# Patient Record
Sex: Male | Born: 1960 | Race: Black or African American | Hispanic: No | Smoking: Current every day smoker
Health system: Southern US, Community
[De-identification: ages and names within clinical notes are randomized; demographics above are authoritative.]

## PROBLEM LIST (undated history)

## (undated) DIAGNOSIS — I639 Cerebral infarction, unspecified: Secondary | ICD-10-CM

## (undated) DIAGNOSIS — G8190 Hemiplegia, unspecified affecting unspecified side: Secondary | ICD-10-CM

## (undated) HISTORY — PX: APPENDECTOMY: SHX54

## (undated) HISTORY — PX: TONSILLECTOMY: SUR1361

## (undated) HISTORY — DX: Cerebral infarction, unspecified: I63.9

---

## 1997-09-19 ENCOUNTER — Encounter: Admission: RE | Admit: 1997-09-19 | Discharge: 1997-12-18 | Payer: Self-pay | Admitting: Neurology

## 1997-11-13 ENCOUNTER — Ambulatory Visit (HOSPITAL_COMMUNITY): Admission: RE | Admit: 1997-11-13 | Discharge: 1997-11-13 | Payer: Self-pay | Admitting: Neurology

## 1999-06-22 ENCOUNTER — Encounter: Payer: Self-pay | Admitting: Emergency Medicine

## 1999-06-22 ENCOUNTER — Emergency Department (HOSPITAL_COMMUNITY): Admission: EM | Admit: 1999-06-22 | Discharge: 1999-06-22 | Payer: Self-pay | Admitting: Emergency Medicine

## 2002-08-06 ENCOUNTER — Encounter: Payer: Self-pay | Admitting: Family Medicine

## 2002-08-06 ENCOUNTER — Encounter: Admission: RE | Admit: 2002-08-06 | Discharge: 2002-08-06 | Payer: Self-pay | Admitting: Family Medicine

## 2004-11-30 ENCOUNTER — Emergency Department (HOSPITAL_COMMUNITY): Admission: EM | Admit: 2004-11-30 | Discharge: 2004-11-30 | Payer: Self-pay | Admitting: Family Medicine

## 2004-12-06 ENCOUNTER — Emergency Department (HOSPITAL_COMMUNITY): Admission: EM | Admit: 2004-12-06 | Discharge: 2004-12-06 | Payer: Self-pay | Admitting: Emergency Medicine

## 2008-02-09 ENCOUNTER — Emergency Department (HOSPITAL_COMMUNITY): Admission: EM | Admit: 2008-02-09 | Discharge: 2008-02-10 | Payer: Self-pay | Admitting: Emergency Medicine

## 2010-05-14 LAB — BASIC METABOLIC PANEL
BUN: 7 mg/dL (ref 6–23)
CO2: 28 mEq/L (ref 19–32)
Calcium: 9.2 mg/dL (ref 8.4–10.5)
Chloride: 105 mEq/L (ref 96–112)
Creatinine, Ser: 0.85 mg/dL (ref 0.4–1.5)
GFR calc Af Amer: >60 mL/min (ref >60)
GFR calc non Af Amer: >60 mL/min (ref >60)
Glucose, Bld: 100 mg/dL — ABNORMAL HIGH (ref 70–99)
Potassium: 3.2 mEq/L — ABNORMAL LOW (ref 3.5–5.1)
Sodium: 141 mEq/L (ref 135–145)

## 2010-05-14 LAB — CK TOTAL AND CKMB (NOT AT ARMC)
Relative Index: INVALID (ref 0.0–2.5)
Total CK: 79 U/L (ref 7–232)

## 2010-05-14 LAB — DIFFERENTIAL
Basophils Relative: 1 % (ref 0–1)
Eosinophils Relative: 2 % (ref 0–5)
Monocytes Absolute: 0.4 10*3/uL (ref 0.1–1.0)
Monocytes Relative: 7 % (ref 3–12)
Neutro Abs: 2.2 10*3/uL (ref 1.7–7.7)

## 2010-05-14 LAB — CBC
MCHC: 34.1 g/dL (ref 30.0–36.0)
MCV: 87.3 fL (ref 78.0–100.0)
RBC: 5.37 MIL/uL (ref 4.22–5.81)
RDW: 13.6 % (ref 11.5–15.5)

## 2010-06-15 NOTE — Consult Note (Signed)
Vincent Byrd              ACCOUNT NO.:  [id number]   MEDICAL RECORD NO.:  [id number]          PATIENT TYPE:  EMS   LOCATION:  ED                           FACILITY:  [facility]   PHYSICIAN:  Deanna Artis. Hickling, M.D.DATE OF BIRTH:  1960/07/21   DATE OF CONSULTATION:  12/06/2004  DATE OF DISCHARGE:                                   CONSULTATION   HISTORY OF PRESENT ILLNESS:  [name] is a nearly 50 year old right handed  African American gentleman who presented tonight with chief complaint of  numbness in his left arm.   The patient was hospitalized at [facility] in 1997 with sudden onset  of left sided weakness.  The patient had surgery in the right brain.  I am  not certain why this was done, whether there was increase in intracranial  pressure or subdural.  CT scan of the brain carried out tonight shows no  clear cut lesions, normally mild prominence of the right sylvian fissure in  comparison with the left.   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  He travels in a motorized wheelchair.   The patient has had pain in his right shoulder.  This was evaluated six days  ago in the [facility] emergency room, and did not show any definite  findings.  He was treated with nonsteroidal anti-inflammatories as well as a  tapering dose of prednisone.  He has had some relief.   The patient continues to complain of pain in his neck.  Tonight, as he was  laying down, around 6:45 p.m., he noted that there was paresthetic numbness  in his left arm.  This went from his shoulder down to his fingertips.  There  was no other change in his examination.  He was brought into [facility] and code-stroke was called.  I evaluated the patient at the request  of the emergency room physicians because of this.   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  He takes an Ecotrin every day.  His other medications at the time  are p.r.n. including Etodolac 400 mg b.i.d., prednisone in a tapering dose  and hydrocodone AT/AP.   ALLERGIES:  No known drug allergies.   PAST MEDICAL HISTORY:  1.  Right craniotomy.  2.  Appendectomy.  3.  Feeding gastrostomy which has since been removed.  4.  Tonsillectomy.   SOCIAL HISTORY:  The patient drinks a 40 ounce can of beer sporadically.  He  has not had one in nearly a month.  He smokes a pack of cigarettes per day.   FAMILY HISTORY:  Remarkable for diabetes mellitus in mother and two sisters.  Atherosclerotic cardiovascular disease in his father.  No history of stroke  in his family.   PHYSICAL EXAMINATION:  GENERAL APPEARANCE:  This is a man with labile  motions who periodically laughs when he is stressed.  He does not cry.  He  shows no signs of dysphagia.  He has no dysarthria.  The patient is awake,  alert and oriented.  HEENT:  Cranial nerves:  Round reactive pupils.  Visual fields full.  He has  cataracts, and I cannot see inside his eyes.  He has symmetric facial  strength, midline tongue and uvula.  Air conduction greater than bone  conduction bilaterally.  NEUROLOGICAL:  Motor examination shows a spastic paresis involving the left  side more so than the right, but the right leg is significantly and rather  severely involved.  He has spastic 4/5 strength in the biceps, 3/5 in the  triceps, 3/5 in the deltoid, is nearly 0/5 distally on the left.  The left  leg he can barely lift it against gravity.  He is not able to flex or extend  it and can barely wiggle his foot back in dorsiflexion and plantar flexion.  The right side shows significant weakness in the 3/5 range in the hip  flexors.  He can slightly bend his knee and extend it and dorsiflexor and  plantarflex his foot.  The right side is entirely normal with excellent  strength.  Good fine motor movements and  finger appositions and no pronator  drift.  Sensation shows a left hemihypesthesia.  There is a hyperesthesia in  the right arm.  Can find no dermatomal pattern nor can I find a flexus  pattern as best I can determine.  The patient had good vibratory sensation  and fairy equal proprioception.  His stereognosis is actually fairly good,  both in the right hand and the pruritic left hand.  Deep tendon reflexes  show left reflex predominance, for the patient has bilateral ankle clonus,  left greater than right.  He has bilateral extensor plantar responses.   IMPRESSION:  Numbness in the left arm.  I do not believe that this is a  stroke.  As I mentioned, I reviewed the CT scan and it shows craniotomy  defect, but no other significant focal findings.  EKG showed regular sinus  rhythm with minimal voltage criteria for left ventricular hypertrophy.  C-  met:  Sodium 138, potassium 3.3, chloride 101, CO2 26, glucose 79, BUN 10,  creatinine 1, calcium 9.2, CBC and PTT are pending at this time.   When I examined the patient's neck, he does have some pain and extension,  flexion, bringing his ears toward his shoulder or extend toward the  shoulder.  There is some tenderness in the neck.  It is certainly possible  that this gentleman has significant spondylosis which may be causing some  impingement syndrome.   Given that his spastic paresis is old and stable, I would recommend an MRI  scan of his cervical spine to further evaluate his numbness and his neck  pain.  This can be done as an outpatient.  I told the patient that he needed  to return to the hospital if he had extension and numbness past the left  arm.  I do not believe that this gentleman had a stroke, and therefore, we  will send him home on his Ecotrin and with orders to see Dr. Clovis Riley in  follow up to evaluate his neck and the numb left arm.      Deanna Artis. Sharene Skeans, M.D. Electronically Signed     [facility]/MEDQ  D:  12/06/2004   T:  12/07/2004  Job:  [id number]

## 2014-02-10 DIAGNOSIS — Z13220 Encounter for screening for lipoid disorders: Secondary | ICD-10-CM | POA: Diagnosis not present

## 2014-02-10 DIAGNOSIS — Z125 Encounter for screening for malignant neoplasm of prostate: Secondary | ICD-10-CM | POA: Diagnosis not present

## 2014-02-10 DIAGNOSIS — I6789 Other cerebrovascular disease: Secondary | ICD-10-CM | POA: Diagnosis not present

## 2014-02-10 DIAGNOSIS — Z131 Encounter for screening for diabetes mellitus: Secondary | ICD-10-CM | POA: Diagnosis not present

## 2014-02-10 DIAGNOSIS — F17210 Nicotine dependence, cigarettes, uncomplicated: Secondary | ICD-10-CM | POA: Diagnosis not present

## 2014-02-14 ENCOUNTER — Other Ambulatory Visit (HOSPITAL_COMMUNITY): Payer: Self-pay | Admitting: Internal Medicine

## 2014-02-14 DIAGNOSIS — I639 Cerebral infarction, unspecified: Secondary | ICD-10-CM

## 2014-02-14 DIAGNOSIS — I739 Peripheral vascular disease, unspecified: Secondary | ICD-10-CM

## 2014-02-15 ENCOUNTER — Ambulatory Visit (HOSPITAL_COMMUNITY): Payer: Commercial Managed Care - HMO | Attending: Internal Medicine

## 2014-02-15 ENCOUNTER — Ambulatory Visit (HOSPITAL_COMMUNITY): Payer: Commercial Managed Care - HMO

## 2014-02-15 ENCOUNTER — Telehealth (HOSPITAL_COMMUNITY): Payer: Self-pay | Admitting: Unknown Physician Specialty

## 2014-03-13 DIAGNOSIS — I6789 Other cerebrovascular disease: Secondary | ICD-10-CM | POA: Diagnosis not present

## 2014-03-13 DIAGNOSIS — R2689 Other abnormalities of gait and mobility: Secondary | ICD-10-CM | POA: Diagnosis not present

## 2014-05-20 ENCOUNTER — Other Ambulatory Visit: Payer: Self-pay

## 2014-05-20 NOTE — Patient Outreach (Signed)
[facility]) Care Management  05/20/2014  Vincent GladHerbert F Byrd 1960-02-19 [id number]  Screening Triage Note: Referral Source:  [employer] Referral Date:  05/04/2014 Referral Issues reported:   Patient with hemiplegia of Left extremities and stroke. Risk of falls/injuries.  Patient in need of dentist, eye doctor and provider.   Primary MD per Epic:  Fleet ContrasEdwin Avbuere, MD, [facility] [phone]; Fax: [phone]  RN CM attempted call to patient at [phone].  Voice mail automation states voice mail full and unable to leave message at this number.  RN CM scheduled next attempted call within one week.   Donato Schultzrystal Jaquelynn Wanamaker, RN, BSN, [facility], CCM  [facility] Care Management Care Management Coordinator [phone] Office [phone] Direct [phone] Cell

## 2014-05-24 ENCOUNTER — Other Ambulatory Visit: Payer: Self-pay

## 2014-05-24 ENCOUNTER — Telehealth: Payer: Self-pay

## 2014-05-24 NOTE — Patient Outreach (Signed)
[facility]) Care Management  05/24/2014  Vincent Byrd February 26, 1960 [id number]   Screening Triage Note: Outreach Call # 3 to new # [phone] cell.  Referral Source: [facility] Referral Date: 05/04/2014 Issues:   -Patient with hemiplegia of Left extremities and stroke. -Risk of falls/injuries and no assistance in the home.  -Patient in need of dentist, eye doctor and provider.  Primary MD:   Fleet ContrasEdwin Avbuere, MD, [facility] [phone] Last appt 01/2014 for W/C assessment.   Next appt:  None scheduled; patient refuses to schedule next appt until billing issues are resolved.  States he normally sees MD every 6 months in April and October.   Primary MD:  Patient states he is on a wait list to receive [facility] and plans to change primary MDs at that time.  States he will notify [employer] to request new insurance card with correct provider at that time.  States he owes a balance to Dr. Fleet ContrasEdwin Avbuere, MD office due to office filed his [facility] than [facility].  Patient C/O MD appt charges in January being too much and disagrees with charges.   RN CM confirmed patient has provided MD office with copy of correct insurance card.   RN CM advised patient he may request a copy of itemized billing charges for visit which may help understand charges and provide clarification needed. RN CM discussed patient bill of rights and ok to request copy of bill.    Patient verbalized understanding and stated he would contact MD to request copy. Barrier to Care identified:  Patient refuses to schedule appt with MD until billing issues resolved. RN CM provided education to patient on importance of MD appt compliance to avoid unnecessary health issues arising from lack of follow-up care.   Patient verbalized understanding of identified barrier once RN CM discussed openly with patient.   Social:  Divorced lives in his home with  his son.  States son works two jobs and has two children; stays with his girlfriend most of the time and is not always available to assist as a caregiver.  Son helps patient get a shower 3 days a week and other needs as availability permits.  Caregiver:  Seymour BarsRasheed E. Nolton, Son,  [phone] cell.  Advance Directive:  None.   RN CM provided Verbal education and patient states interest in completion.  [facility] agreed [facility] referral for assistance with Advance Directive.  Patient verified he recently moved to new address :  [address], [place], [place] [address]  [phone] cell.  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  States no use of Left arm requiring electric W/C.   Patient only able to ambulate with gait belt, walker or cane with son's assistance, otherwise confined to electric W/C. Patient confirms a fall in his shower about 6 months ago with no injuries.  States he missed the grab bar.  / no injury 6 months ago.  States he is in the process of trying to get a step in shower.  Wt: 175-180 estimated by patient.  No scales in the home.  Patient states he is on a wait list with [place] DSS for services for assistance with house cleaning and care during the day.  States he has trouble with transfers but is  able to bath himself. Case Worker:  Mrs. Orson AloeHenderson.   Transportation:  Self.  Patient owns a handicap accessible [name] and is able to drive.  Also uses [employer] as needed.  DME:  Cane, walker, gait belt, BSC, Electric W/C (new chair received approximately 02/2014), grab bars in shower.   Health History:  Patient only admitted to CVA in 1999.  Patient denied heart failure or any other conditions.    Memory Patient admits to memory issues get in the way of his ability to remember his MD appts.  Patient confirms he uses a calendar but still forgets.   RN CM encouraged to continue to write down all  appts and community RN CM will assess for possible other interventions that may help patient to self manage.   Medications: Patient states he only takes one medication:  ASA  every day but no other medications.   Preventive Care / Dentist:  Patient states he sees dentist once a year and understands he has health coverage for routine cleaning and x-ray but does not have any coverage for additional dental care needs.   RN CM encouraged to remain compliant with yearly exam and if further dental needs arise; resources can be explored such as dental schools or other dental resource options.  RN CM will refer to SW for dental resources.  Preventive Care / Eye MD:   Not discussed this call.   Assessment to be continued on Community RN CM initial assessment.  RN CM reviewed insurance benefits for dental care.   RN CM introduced [facility] services and patient agreed to services for telephonic screening and continued [facility] services for telephonic as needed, community case manager and social work referral.  RN CM encouraged to report changes in his condition to MD as needed to avoid unnecessary health issues.   RN CM encouraged to keep all MD appt's Patient confirmed awareness of use of 911 services for emergency needs.  RN CM provided name and contact # for concerns or questions.  RN CM advised that community RN CM will contact patient within 10 business days to schedule appt.  Patient verbalized understanding of plan of care and thanked RN CM for services and assistance.   Referrals:  [facility] Community RN CM and care coordination:  MD compliance, medication reconciliation (verification patient is only taking one medication).  -[facility] Social Work services:  Merchant navy officer, DSS follow-up on home assistant,  community resources for dentistry,  -Address / Contact update :  [address], [place], [place] [address]  [phone] cell.   Request sent to Nena Polio.  Donato Schultz, RN, BSN, [facility], CCM   [facility] Management Coordinator [phone] Office [phone] Direct [phone] Cell

## 2014-05-24 NOTE — Patient Outreach (Signed)
[facility]) Care Management  05/24/2014  Vincent Byrd [birth year] [id number]   Screening Triage Note: Referral Source: [employer] Referral Date: 05/04/2014 Referral Issues reported:  -Patient with hemiplegia of Left extremities and stroke. -Risk of falls/injuries and no assistance in the home.  -Patient in need of dentist, eye doctor and provider.  Primary MD per Epic: Fleet ContrasEdwin Avbuere, MD, [facility] [phone]; Fax: [phone]  Outreach call # 2.  Contact reached at [phone] and states wrong # for this person.  Phone search:  Other contact # located per [facility] plan.  [phone] Additional outreach scheduled.   Donato Schultzrystal Mayumi Summerson, RN, BSN, [facility], CCM  [facility] Care Management Care Management Coordinator [phone] Office [phone] Direct [phone] Cell

## 2014-05-26 ENCOUNTER — Ambulatory Visit: Payer: Commercial Managed Care - HMO

## 2014-05-27 ENCOUNTER — Other Ambulatory Visit: Payer: Self-pay

## 2014-05-27 DIAGNOSIS — I639 Cerebral infarction, unspecified: Secondary | ICD-10-CM

## 2014-05-27 NOTE — Patient Outreach (Signed)
This RNCM was successful in making telephone contact with patient for community case management. Patient states he needed home health due to having weakness on one side.  He states he has depended on his son to assist with getting him in and out of the bathtub 4 times per week.  Patient has [employer] coming out on Thursday, Jun 02, 2014 to estimate cost of putting in a handicap tub. Patient requested assistance with finding a community program to assist with paying for the tub and installation.  Patient's reports an estimated monthly income of $1600 per month. This RNCM informed patient that his income make him ineligible for certain programs, however, I would make a referral to [facility] Social Work Department for further program evaluation.  Patient is agreeable to above plan and home visit with this RNCM on Thursday, May 5.

## 2014-05-27 NOTE — Patient Outreach (Signed)
[facility]) Care Management  05/27/2014  Vincent GladHerbert F Byrd January 28, 1961 [id number]   Community case assignment  -[facility] Community RN CM and care coordination: MD compliance, medication reconciliation (verification patient is only taking one medication).  -[facility] Social Work services: Merchant navy officerAdvance Directives, DSS follow-up on home Geophysicist/field seismologistassistant, community resources for dentistry  (RN CM and SW - Please See Screening note dated 05/24/14 for further details).   Order placed.    Donato Schultzrystal Morayo Leven, RN, BSN, [facility], CCM  [facility] Care Management Care Management Coordinator [phone] Office [phone] Direct [phone] Cell

## 2014-06-02 ENCOUNTER — Other Ambulatory Visit: Payer: Self-pay

## 2014-06-02 ENCOUNTER — Ambulatory Visit: Payer: Commercial Managed Care - HMO

## 2014-06-02 NOTE — Patient Outreach (Signed)
[facility] ([facility]) Care Management  06/02/2014  Vincent Byrd 12/16/1960 [id number]    This RNCM met with patient for initial home visit.  This RNCM described [facility]'s benefits, ownership and the referral services to patient.  Patient was also informed [facility]'s services are free and completely voluntary. Patient informed this RNCM his intention of getting another primary care physician because his primary physician wants him to come into for an appointment every 3 months, he states he only wants to go to the doctor every 6 months. Patient further added that he has someone assisting him with finding a new primary care physician.    Patient states he is very cautious about joining different programs because one program in the past left him with an increase premium.    Patient requested the [facility] information packet be left for his viewing along with the consent so he can talk to is new doctor, see if new primary care physician falls under the umbrella of [facility].   Before completion of this home visit, patient admitted having an appointment with his new primary care physician on May 23 and has agreed to contact this RNCM after that visit for an update.   

## 2014-06-09 ENCOUNTER — Other Ambulatory Visit: Payer: Self-pay | Admitting: *Deleted

## 2014-06-09 NOTE — Patient Outreach (Signed)
[facility]) Care Management  06/09/2014  Vincent Byrd 27-Aug-1960 [id number]    Phone call to patient to follow up on interest for in [facility] social [facility] services.  Per patient, he would like assistance with personal care services and possibly identifying an agency that would accept a sliding scale fee.  Per patient, he has been on the waiting list for a CAP(Community Alternatives Program for Disabled Adults)  aid for the last three years and is now 21 on the waiting list.  He is also interested in a referral to the community health response program.  Home visit scheduled for 06/17/14 at 9:00am to complete social work needs assessment.   Adriana ReamsChrystal Blaize Nipper, LCSW [facility] Care Management [phone]

## 2014-06-17 ENCOUNTER — Other Ambulatory Visit: Payer: Self-pay | Admitting: *Deleted

## 2014-06-17 NOTE — Patient Outreach (Signed)
  [facility]) Care Management  [facility]  06/17/2014  Santiago GladHerbert F Gowens 12-05-1960 [id number]  Subjective:    Objective:   Current Medications:  Current Outpatient Prescriptions  Medication Sig Dispense Refill  . aspirin 325 MG tablet Take 325 mg by mouth daily.     No current facility-administered medications for this visit.    Functional Status:  In your present state of health, do you have any difficulty performing the following activities: 06/17/2014 05/24/2014  Hearing? N N  Vision? Y -  Difficulty concentrating or making decisions? Malvin JohnsY Y  Walking or climbing stairs? Y Y  Dressing or bathing? N Y  Doing errands, shopping? N Y  Quarry managerreparing Food and eating ? N Y  Using the Toilet? N Y  In the past six months, have you accidently leaked urine? N -  Do you have problems with loss of bowel control? N -  Managing your Medications? N N  Managing your Finances? N -  Housekeeping or managing your Housekeeping? Malvin JohnsY Y    Fall/Depression Screening:  PHQ 2/9 Scores 06/02/2014 05/27/2014  PHQ - 2 Score 0 0    Assessment: This Child psychotherapistsocial worker completed initial home visit to assess for social work needs.  Patient very friendly and engaging during the visit, however very reluctant to sign consent form fearing that it would make his insurance go up.  This Child psychotherapistsocial worker assured patient that he would be receiving  [facility] case management services at no charge to him and that participation isn the program was voluntary. However patient remained skeptical and would like to confirm this information with his insurance carrier before the consent is signed.    Patient discussed being in need of personal care services and is currently #21 on the waiting list for a CAP aid(Community Alternative Program for Disabled and Elderly Adults) through [facility]. Patient states that until an aid is assigned,  his son is available to assist with his care taking needs. Patient  also working with [facility] [employer] to get a walk in shower due to difficulty lifting his legs up and over the tub to get in.   Patient reports using [employer] for his transportation needs.  Patient is a Sales executivemilitary veteran, however did not serve during war time to be eligible for VA pension and has been denied.  He is currently working with an attorney to file an appeal.  Patient declined need for resources for dentistry, stating that he has been going to the same dentist for several years, and although his dentist is out of network, he is not willing to switch.  Patient also declined need for an eye doctor, stating that he has made an appointment with [facility] for a eye exam.  Advanced Directive discussed, completion emphasized.  Patient states that he and his son will be seeing an attorney next week to draw up his Will and will take the Advanced Directive paperwork with him to get notarized as well.  Plan: Patient assessed and has no social work needs at this time.  Patient's case to be closed to social work.     Adriana ReamsChrystal Land, LCSW [facility] Care Management [phone]

## 2014-06-20 ENCOUNTER — Ambulatory Visit: Payer: Commercial Managed Care - HMO | Admitting: Family Medicine

## 2014-06-30 ENCOUNTER — Ambulatory Visit: Payer: Commercial Managed Care - HMO | Admitting: Family Medicine

## 2014-07-07 ENCOUNTER — Encounter: Payer: Self-pay | Admitting: Family Medicine

## 2014-07-07 ENCOUNTER — Ambulatory Visit (INDEPENDENT_AMBULATORY_CARE_PROVIDER_SITE_OTHER): Payer: Commercial Managed Care - HMO | Admitting: Family Medicine

## 2014-07-07 VITALS — BP 128/74 | HR 75 | Temp 98.1°F | Ht 69.0 in

## 2014-07-07 DIAGNOSIS — Z114 Encounter for screening for human immunodeficiency virus [HIV]: Secondary | ICD-10-CM | POA: Diagnosis not present

## 2014-07-07 DIAGNOSIS — L84 Corns and callosities: Secondary | ICD-10-CM | POA: Diagnosis not present

## 2014-07-07 DIAGNOSIS — B351 Tinea unguium: Secondary | ICD-10-CM | POA: Diagnosis not present

## 2014-07-07 DIAGNOSIS — I69854 Hemiplegia and hemiparesis following other cerebrovascular disease affecting left non-dominant side: Secondary | ICD-10-CM | POA: Diagnosis not present

## 2014-07-07 DIAGNOSIS — M79602 Pain in left arm: Secondary | ICD-10-CM

## 2014-07-07 DIAGNOSIS — I69354 Hemiplegia and hemiparesis following cerebral infarction affecting left non-dominant side: Secondary | ICD-10-CM | POA: Insufficient documentation

## 2014-07-07 DIAGNOSIS — Z1159 Encounter for screening for other viral diseases: Secondary | ICD-10-CM | POA: Diagnosis not present

## 2014-07-07 LAB — POCT GLYCOSYLATED HEMOGLOBIN (HGB A1C): HEMOGLOBIN A1C: 5.4

## 2014-07-07 MED ORDER — GABAPENTIN 300 MG PO CAPS: 300.0000 mg | ORAL_CAPSULE | Freq: Every day | ORAL | Status: DC

## 2014-07-07 NOTE — Patient Instructions (Addendum)
Great to meet you!  Start the gabapenti at night  Come back in 4-6 week sto dicuss your paperwork, I will send you a letter with your lab results

## 2014-07-07 NOTE — Assessment & Plan Note (Signed)
Old stroke, unclear etiology as he states that it is due to an infection but denies meningitis Labs today Has residual left arm weakness.

## 2014-07-07 NOTE — Progress Notes (Signed)
Patient ID: Vincent Byrd, male   DOB: 24-Nov-1960, 54 y.o.   MRN: [id number]   HPI  Patient presents today to establish care.  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 Past surgical history includes an appendectomy when he was 54 year old   Smoking Has tried to quit before using 100 quit out, also use nicotine patches. Would like to use Chantix but has mood concerns  Left arm pain Has chronic left arm pain not which came on after his stroke in 1999. He has not taken any medications for this recently. He jokingly request OxyContin or Demerol. He states that if he turns his head to the left for a prolonged time that his left arm goes numb and begins to hurt severely.  Problems Requests referral to podiatry States that he has right foot callus that's been there chronically since having a bleeding blister on a long march in the [employer]. It does not bleed anymore but he would like to get it addressed by foot doctor. He also has thickened yellow nails and wonders if he has foot fungus.  Past medical, surgical, social history were reviewed and updated in relative portions of EMR   Smoking status noted - current - counseled ROS: Per HPI, plus:   Trouble seeing out of his left eye Psych: Stress and memory problems Neuro: Left arm and leg weakness Otherwise a 11 point review of systems negative  Objective: BP 128/74 mmHg  Pulse 75  Temp(Src) 98.1 F (36.7 C) (Oral)  Ht 5\' 9"  (1.753 m) Gen: NAD, alert, cooperative with exam HEENT: NCAT, MMM, EOMI, CV: RRR, good S1/S2, no murmur, no bruit, distant heart sounds in general  Resp: CTABL, no wheezes, non-labored, however moderate to poor air movement Abd: SNTND, BS present, no guarding or organomegaly Ext: No edema, warm  Feet: 2+ DP pulses bilaterally, no major lesions, however thick callus on the right posterior  heel, thickened yellow nails throughout Neuro: Alert and oriented, strength 4/5 on right upper and lower extremity, strength 2/5 on upper and lower left extremities  Assessment and plan:  Left arm pain Left arm pain with left head tilting, suspicious for radicular nerve pain Gabapentin at night Plain film to evaluate cervical spine  Callus Thick chronic callus on R heel, requwesting op  Hemiparesis affecting left side as late effect of stroke Old stroke, unclear etiology as he states that it is due to an infection but denies meningitis Labs today Has residual left arm weakness.  Onychomycosis Thickened yellow toenails bilaterally, all 10 Requests referral to podiatry, made today for thick callus on his right posterior heel    Orders Placed This Encounter  Procedures  . DG Cervical Spine Complete    Standing Status: Future     Number of Occurrences:      Standing Expiration Date: 09/06/2015    Order Specific Question:  Reason for Exam (SYMPTOM  OR DIAGNOSIS REQUIRED)    Answer:  L arm pain, radiculopathy    Order Specific Question:  Preferred imaging location?    Answer:  [facility]  . Lipid Panel  . CBC with Differential  . Comprehensive metabolic panel  . HIV antibody  . Ambulatory referral to Podiatry    Referral Priority:  Routine    Referral Type:  Consultation    Referral Reason:  Specialty Services Required    Requested  Specialty:  Podiatry    Number of Visits Requested:  1  . POCT glycosylated hemoglobin (Hb A1C)    Meds ordered this encounter  Medications  . gabapentin (NEURONTIN) 300 MG capsule    Sig: Take 1 capsule (300 mg total) by mouth at bedtime.    Dispense:  30 capsule    Refill:  2

## 2014-07-07 NOTE — Assessment & Plan Note (Signed)
Thick chronic callus on R heel, requwesting op

## 2014-07-07 NOTE — Assessment & Plan Note (Signed)
Left arm pain with left head tilting, suspicious for radicular nerve pain Gabapentin at night Plain film to evaluate cervical spine

## 2014-07-07 NOTE — Assessment & Plan Note (Signed)
Thickened yellow toenails bilaterally, all 10 Requests referral to podiatry, made today for thick callus on his right posterior heel

## 2014-07-08 LAB — COMPREHENSIVE METABOLIC PANEL
ALBUMIN: 4.3 g/dL (ref 3.5–5.2)
ALT: 11 U/L (ref 0–53)
AST: 15 U/L (ref 0–37)
Alkaline Phosphatase: 39 U/L (ref 39–117)
BILIRUBIN TOTAL: 0.5 mg/dL (ref 0.2–1.2)
BUN: 10 mg/dL (ref 6–23)
CALCIUM: 9.3 mg/dL (ref 8.4–10.5)
CHLORIDE: 104 meq/L (ref 96–112)
CO2: 25 mEq/L (ref 19–32)
CREATININE: 0.88 mg/dL (ref 0.50–1.35)
GLUCOSE: 99 mg/dL (ref 70–99)
Potassium: 3.9 mEq/L (ref 3.5–5.3)
SODIUM: 140 meq/L (ref 135–145)
TOTAL PROTEIN: 7 g/dL (ref 6.0–8.3)

## 2014-07-08 LAB — CBC WITH DIFFERENTIAL/PLATELET
BASOS PCT: 0 % (ref 0–1)
Basophils Absolute: 0 10*3/uL (ref 0.0–0.1)
EOS ABS: 0.1 10*3/uL (ref 0.0–0.7)
EOS PCT: 1 % (ref 0–5)
HEMATOCRIT: 49.5 % (ref 39.0–52.0)
HEMOGLOBIN: 16.4 g/dL (ref 13.0–17.0)
LYMPHS ABS: 2 10*3/uL (ref 0.7–4.0)
Lymphocytes Relative: 39 % (ref 12–46)
MCH: 28.5 pg (ref 26.0–34.0)
MCHC: 33.1 g/dL (ref 30.0–36.0)
MCV: 85.9 fL (ref 78.0–100.0)
MPV: 10.7 fL (ref 8.6–12.4)
Monocytes Absolute: 0.4 10*3/uL (ref 0.1–1.0)
Monocytes Relative: 7 % (ref 3–12)
Neutro Abs: 2.7 10*3/uL (ref 1.7–7.7)
Neutrophils Relative %: 53 % (ref 43–77)
PLATELETS: 195 10*3/uL (ref 150–400)
RBC: 5.76 MIL/uL (ref 4.22–5.81)
RDW: 14.5 % (ref 11.5–15.5)
WBC: 5.1 10*3/uL (ref 4.0–10.5)

## 2014-07-08 LAB — LIPID PANEL
CHOL/HDL RATIO: 3.9 ratio
CHOLESTEROL: 140 mg/dL (ref 0–200)
HDL: 36 mg/dL — ABNORMAL LOW (ref >=40)
LDL Cholesterol: 91 mg/dL (ref 0–99)
Triglycerides: 67 mg/dL (ref <150)
VLDL: 13 mg/dL (ref 0–40)

## 2014-07-08 LAB — HIV ANTIBODY (ROUTINE TESTING W REFLEX): HIV 1&2 Ab, 4th Generation: NONREACTIVE

## 2014-07-11 ENCOUNTER — Encounter: Payer: Self-pay | Admitting: Family Medicine

## 2014-07-12 NOTE — Patient Outreach (Signed)
[facility]) Care Management  07/12/2014  Vincent Byrd 1960-09-15 [id number]   Notification from Vincent Beck, RN to close case as patient refused [facility] Care Management services.  Vincent Byrd. Vincent Byrd [facility] Care Management [facility] CM Assistant Phone: [phone] Fax: [phone]

## 2014-07-14 ENCOUNTER — Telehealth: Payer: Self-pay | Admitting: Internal Medicine

## 2014-07-14 NOTE — Telephone Encounter (Signed)
Pt says he was told by his pcp to make an appt to go over some paperwork, pt says his podiatrist is filling out that paperwork so he doesn't need an appt unless pcp needs to see him for anything else, if so please call pt to let him know.

## 2014-07-15 NOTE — Telephone Encounter (Signed)
Covering for Dr. Ermalinda Memos; unclear from last note if pt needs to see Dr. Ermalinda Memos again, or not. Will defer to Dr. Ermalinda Memos and route call to him to f/u on Monday. --CMS

## 2014-07-22 ENCOUNTER — Ambulatory Visit: Payer: Commercial Managed Care - HMO | Admitting: Podiatry

## 2014-09-02 ENCOUNTER — Ambulatory Visit: Payer: Commercial Managed Care - HMO | Admitting: Podiatry

## 2014-09-09 ENCOUNTER — Ambulatory Visit: Payer: Commercial Managed Care - HMO | Admitting: Podiatry

## 2014-11-04 ENCOUNTER — Encounter: Payer: Self-pay | Admitting: Family Medicine

## 2014-11-04 ENCOUNTER — Ambulatory Visit (INDEPENDENT_AMBULATORY_CARE_PROVIDER_SITE_OTHER): Payer: Commercial Managed Care - HMO | Admitting: Family Medicine

## 2014-11-04 VITALS — BP 126/73 | HR 84 | Temp 98.6°F | Ht 69.0 in

## 2014-11-04 DIAGNOSIS — Z720 Tobacco use: Secondary | ICD-10-CM | POA: Diagnosis not present

## 2014-11-04 DIAGNOSIS — F419 Anxiety disorder, unspecified: Secondary | ICD-10-CM | POA: Diagnosis not present

## 2014-11-04 DIAGNOSIS — M79602 Pain in left arm: Secondary | ICD-10-CM

## 2014-11-04 DIAGNOSIS — Z23 Encounter for immunization: Secondary | ICD-10-CM

## 2014-11-04 DIAGNOSIS — I6930 Unspecified sequelae of cerebral infarction: Secondary | ICD-10-CM

## 2014-11-04 DIAGNOSIS — E785 Hyperlipidemia, unspecified: Secondary | ICD-10-CM | POA: Diagnosis not present

## 2014-11-04 DIAGNOSIS — I69354 Hemiplegia and hemiparesis following cerebral infarction affecting left non-dominant side: Secondary | ICD-10-CM

## 2014-11-04 MED ORDER — GABAPENTIN 300 MG PO CAPS: 300.0000 mg | ORAL_CAPSULE | Freq: Every day | ORAL | Status: DC

## 2014-11-04 MED ORDER — ATORVASTATIN CALCIUM 40 MG PO TABS
40.0000 mg | ORAL_TABLET | Freq: Every day | ORAL | Status: DC
Start: 2014-11-04 — End: 2014-12-15

## 2014-11-04 NOTE — Patient Instructions (Signed)
Dear Vincent Byrd, Thank you for coming in to clinic today. It was good to see you!  1. For your Cholesterol - we will start medicine today (statin therapy), Atorvastatin  take one tablet nightly every night - as discussed if you get muscle aches after taking this medication it may improve in few weeks, otherwise if worsening please call us and follow-up 2. For smoking - recommend to mentally prepare to cut back and get ready to quit. Please ask your insurance to see what medicines are covered. 3. For anxiety and nerves - try Gabapentin x 2 capsules at night ( )  4. In future we can discuss Erectile Dysfunction and consider referral for more affordable management  TDap shot today Recommend flu shot  Please send [place] your Record from Colonoscopy to update your chart  Please schedule a follow-up appointment with Dr. Althea Charon in 6 months for follow-up Prior Stroke, Cholesterol, Smoking, or sooner as needed  - If ready to quit smoking earlier, please schedule a Smoking Cessation visit with Dr. Raymondo Band in Pharmacy Clinic  If you have any other questions or concerns, please feel free to call the clinic to contact me. You may also schedule an earlier appointment if necessary.  However, if your symptoms get significantly worse, please go to the Emergency Department to seek immediate medical attention.  Saralyn Pilar, DO [facility]

## 2014-11-04 NOTE — Assessment & Plan Note (Signed)
Unclear if related to prior CVA and some chronic residual nerve pain / left hemiparesis, worse at night, affects his sleep some. - Currently on Gabapentin with improvement  Plan: 1. Increase gabapentin from 300 to  qhs trial

## 2014-11-04 NOTE — Assessment & Plan Note (Signed)
Chronic L-hemiparesis, unchanged  Plan: 1. Continue ASA  daily 2. Continue motorized wheelchair

## 2014-11-04 NOTE — Assessment & Plan Note (Signed)
Improved on Gabapentin. Did not get C-spine x-rays as previously ordered.  Plan: 1. Increased gabapentin from  qhs to , more for anxiety as pain seems controlled, and help sleep

## 2014-11-04 NOTE — Progress Notes (Signed)
Subjective:    Patient ID: Vincent Byrd, male    DOB: 1961-01-15, 54 y.o.   MRN: [id number]  Vincent Byrd is a 54 y.o. male presenting on 11/04/2014 for Follow-up  HPI  FOLLOW-UP CHOLESTEROL LAB RESULTS / DYSLIPIDEMIA:  - Last office visit to establish with [facility] in 06/2014, had labs done including fasting lipid panel, would like to review results today. TC 140, TG 67, HDL 36, LDL 91 - He has never been on statin therapy. Does have h/o CVA with residual L-hemiparesis. Active smoker. - Lifestyle: Tries to reduce fats with limited fried food, smaller portion size. Unable to exercise with limited ambulation only with walker, 10 ft, mostly wheelchair bound  TOBACCO ABUSE: - Active smoker since 54 yr old, about >30 years smoking, usually smokes 0.5 to 1ppd - No previous successful quit attempts, wants to quit but is not fully mentally prepared to quit at this time, also concerned about insurance coverage on rx medications, has never tried NRT or medications  H/o CVA, with chronic residual Left sided hemiparesis - Known chronic history with prior CVA 1999 with chronic residual Left sided hemiparesis, currently wheelchair bound, has motorized wheelchair, can ambulate only limited few feet with walker and support. 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 However has followed with [facility] in the past. Denies ever trying any viagra or similar medications or other therapies  HM: - Prior history of Colonoscopy around age 50, not in records, asked patient to request this record and submit it to update his chart  Past Medical History  Diagnosis Date  . Stroke  [facility])     Social History   Social History  . Marital Status: Single    Spouse Name: N/A  . Number of Children: N/A  . Years of Education: N/A   Occupational History  . Not on file.   Social History Main Topics  . Smoking status: Current Every Day Smoker -- 0.75 packs/day    Types: Cigarettes  . Smokeless tobacco: Not on file  . Alcohol Use: 3.6 oz/week    0 Standard drinks or equivalent, 6 Cans of beer per week  . Drug Use: No  . Sexual Activity: Not on file   Other Topics Concern  . Not on file   Social History Narrative    Current Outpatient Prescriptions on File Prior to Visit  Medication Sig  . aspirin 325 MG tablet Take 325 mg by mouth daily.   No current facility-administered medications on file prior to visit.    Review of Systems  Constitutional: Negative for fever, chills, diaphoresis, activity change, appetite change and fatigue.  HENT: Negative for congestion and hearing loss.   Eyes: Negative for visual disturbance.  Respiratory: Positive for cough (chronic smoking). Negative for chest tightness, shortness of breath and wheezing.   Cardiovascular: Negative for chest pain, palpitations and leg swelling.  Gastrointestinal: Negative for nausea, vomiting, abdominal pain, diarrhea and constipation.  Genitourinary: Negative for dysuria, frequency and hematuria.  Musculoskeletal: Negative for arthralgias and neck pain.  Skin: Negative for rash.  Neurological: Positive for weakness (residual left sided hemiparesis) and numbness. Negative for dizziness, light-headedness and headaches.  Hematological: Negative for adenopathy.  Psychiatric/Behavioral: Negative for  behavioral problems and confusion. The patient is nervous/anxious (occasional, worse at night).    Per HPI unless specifically indicated above     Objective:    BP 126/73 mmHg  Pulse 84  Temp(Src) 98.6 F (37 C) (Oral)  Ht $R'5\' 9"'Pu$  (1.753 m)  Wt   Wt Readings from Last 3 Encounters:  No data found  for Wt    Physical Exam  Constitutional: He is oriented to person, place, and time. He appears well-developed and well-nourished. No distress.  Chronically ill appearing but currently well, in motorized wheelchair  HENT:  Head: Normocephalic and atraumatic.  Mouth/Throat: Oropharynx is clear and moist.  Eyes: Conjunctivae and EOM are normal. Pupils are equal, round, and reactive to light.  Neck: Normal range of motion. Neck supple. No thyromegaly present.  Cardiovascular: Normal rate, regular rhythm, normal heart sounds and intact distal pulses.   No murmur heard. Pulmonary/Chest: Effort normal and breath sounds normal. No respiratory distress. He has no wheezes. He has no rales.  Musculoskeletal: Normal range of motion. He exhibits no edema or tenderness.  Lymphadenopathy:    He has no cervical adenopathy.  Neurological: He is alert and oriented to person, place, and time. No cranial nerve deficit.  Left sided hemiparesis, chronic residual  Skin: Skin is warm and dry. No rash noted. He is not diaphoretic.  Psychiatric: He has a normal mood and affect. His behavior is normal. Judgment and thought content normal.  Nursing note and vitals reviewed.  Results for orders placed or performed in visit on 07/07/14  Lipid Panel  Result Value Ref Range   Cholesterol 140 0 - 200 mg/dL   Triglycerides 67 <150 mg/dL   HDL 36 (L) >=40 mg/dL   Total CHOL/HDL Ratio 3.9 Ratio   VLDL 13 0 - 40 mg/dL   LDL Cholesterol 91 0 - 99 mg/dL  CBC with Differential  Result Value Ref Range   WBC 5.1 4.0 - 10.5 K/uL   RBC 5.76 4.22 - 5.81 MIL/uL   Hemoglobin 16.4 13.0 - 17.0 g/dL   HCT 49.5 39.0 - 52.0 %   MCV 85.9 78.0 - 100.0 fL   MCH 28.5 26.0 - 34.0 pg   MCHC 33.1 30.0 - 36.0 g/dL   RDW 14.5 11.5 - 15.5 %   Platelets 195 150 - 400 K/uL   MPV 10.7 8.6 - 12.4 fL   Neutrophils Relative % 53 43 - 77 %   Neutro Abs 2.7 1.7 - 7.7 K/uL   Lymphocytes Relative 39 12 - 46 %   Lymphs Abs 2.0 0.7 - 4.0 K/uL     Monocytes Relative 7 3 - 12 %   Monocytes Absolute 0.4 0.1 - 1.0 K/uL   Eosinophils Relative 1 0 - 5 %   Eosinophils Absolute 0.1 0.0 - 0.7 K/uL   Basophils Relative 0 0 - 1 %   Basophils Absolute 0.0 0.0 - 0.1 K/uL   Smear Review Criteria for review not met   Comprehensive metabolic panel  Result Value Ref Range   Sodium 140 135 - 145 mEq/L   Potassium 3.9 3.5 - 5.3 mEq/L   Chloride 104 96 - 112 mEq/L   CO2 25 19 - 32 mEq/L   Glucose, Bld 99 70 - 99 mg/dL   BUN 10 6 - 23 mg/dL   Creat 0.88 0.50 - 1.35 mg/dL   Total Bilirubin 0.5 0.2 - 1.2 mg/dL   Alkaline Phosphatase 39 39 - 117 U/L   AST 15 0 -  37 U/L   ALT 11 0 - 53 U/L   Total Protein 7.0 6.0 - 8.3 g/dL   Albumin 4.3 3.5 - 5.2 g/dL   Calcium 9.3 8.4 - 10.5 mg/dL  HIV antibody  Result Value Ref Range   HIV 1&2 Ab, 4th Generation NONREACTIVE NONREACTIVE  POCT glycosylated hemoglobin (Hb A1C)  Result Value Ref Range   Hemoglobin A1C 5.4       Assessment & Plan:   Problem List Items Addressed This Visit      Nervous and Auditory   Hemiparesis affecting left side as late effect of stroke (HCC)    Chronic L-hemiparesis, unchanged  Plan: 1. Continue ASA $RemoveBefo'325mg'tHiBIXgEEeL$  daily 2. Continue motorized wheelchair        Other   Anxiety    Unclear if related to prior CVA and some chronic residual nerve pain / left hemiparesis, worse at night, affects his sleep some. - Currently on Gabapentin with improvement  Plan: 1. Increase gabapentin from 300 to $Remo'600mg'jqgAn$  qhs trial      Relevant Medications   gabapentin (NEURONTIN) 300 MG capsule   Dyslipidemia - Primary    Last lipid panel 06/2014 TC 140, TG 67, HDL 36, LDL 91. - Never on statin therapy - ASCVD 10 yr up to 10.7% with (smoker, mild elevated BP but no HTN, low HDL), prior stroke inc risk - High risk for recurrent stroke with active smoker  Plan: 1. Start high intensity statin therapy - Atorvastatin $RemoveBefore'40mg'bqsolLiNPJhwQ$  daily (if need to switch based on insurance / cost, advised pt to call  back), counseled on muscle aches and side-effects, recently had CMET checked stable LFTs      Relevant Medications   atorvastatin (LIPITOR) 40 MG tablet   History of CVA with residual deficit   Relevant Medications   gabapentin (NEURONTIN) 300 MG capsule   Left arm pain    Improved on Gabapentin. Did not get C-spine x-rays as previously ordered.  Plan: 1. Increased gabapentin from $RemoveBeforeD'300mg'XTTQWFQRcWPdqd$  qhs to $Remo'600mg'EGVpO$ , more for anxiety as pain seems controlled, and help sleep      Relevant Medications   gabapentin (NEURONTIN) 300 MG capsule   Tobacco abuse    Active smoker, chronic >30 years, 0.5 to Mier risk for recurrent CVA  Plan: 1. Counseled on smoking cessation today, pt not fully ready to quit, but interested in medication to help quit, will check with insurance to see what he can afford / coverage, and plan to follow-up 2. Advised schedule with Dr. Valentina Lucks for Smoking Cessation clinic if desired, otherwise he can see me for this       Other Visit Diagnoses    Need for Tdap vaccination        Relevant Orders    Tdap vaccine greater than or equal to 7yo IM (Completed)       Meds ordered this encounter  Medications  . atorvastatin (LIPITOR) 40 MG tablet    Sig: Take 1 tablet (40 mg total) by mouth daily.    Dispense:  30 tablet    Refill:  5  . gabapentin (NEURONTIN) 300 MG capsule    Sig: Take 1-2 capsules (300-600 mg total) by mouth at bedtime.    Dispense:  60 capsule    Refill:  5      Follow up plan: Return in about 6 months (around 05/05/2015) for dyslipidemia, tobacco abuse.  A total of 15 minutes was spent face-to-face with this patient. Over half this time was spent  on counseling patient on the diagnosis and therapeutic options available.  Nobie Putnam, [place], PGY-3

## 2014-11-04 NOTE — Assessment & Plan Note (Signed)
Last lipid panel 06/2014 TC 140, TG 67, HDL 36, LDL 91. - Never on statin therapy - ASCVD 10 yr up to 10.7% with (smoker, mild elevated BP but no HTN, low HDL), prior stroke inc risk - High risk for recurrent stroke with active smoker  Plan: 1. Start high intensity statin therapy - Atorvastatin  daily (if need to switch based on insurance / cost, advised pt to call back), counseled on muscle aches and side-effects, recently had CMET checked stable LFTs

## 2014-11-04 NOTE — Assessment & Plan Note (Signed)
Active smoker, chronic >30 years, 0.5 to 1ppd - Inc risk for recurrent CVA  Plan: 1. Counseled on smoking cessation today, pt not fully ready to quit, but interested in medication to help quit, will check with insurance to see what he can afford / coverage, and plan to follow-up 2. Advised schedule with Dr. Raymondo Band for Smoking Cessation clinic if desired, otherwise he can see me for this

## 2014-11-10 ENCOUNTER — Telehealth: Payer: Self-pay | Admitting: Family Medicine

## 2014-11-10 NOTE — Telephone Encounter (Signed)
Spoke with patient regarding his Asprin.  Advised patient to continue his daily Aspirin along with his Lipitor.  Per Dr. Althea CharonKaramalegos office note from 11/04/14 patient to continue Aspirin 325 mg daily and will start Lipitor.  Patient stated understanding.  Clovis PuMartin, Chrisanna Mishra L, RN

## 2014-11-10 NOTE — Telephone Encounter (Signed)
Pt called and states that he was prescribed Lipitor for his cholesterol and would like to know if it is OK for him to continue taking his asprin. Sadie Reynolds, ASA

## 2014-12-13 ENCOUNTER — Other Ambulatory Visit: Payer: Self-pay | Admitting: *Deleted

## 2014-12-13 NOTE — Telephone Encounter (Signed)
Opened in error. Vincent Byrd  

## 2014-12-15 ENCOUNTER — Other Ambulatory Visit: Payer: Self-pay | Admitting: *Deleted

## 2014-12-15 DIAGNOSIS — E785 Hyperlipidemia, unspecified: Secondary | ICD-10-CM

## 2014-12-15 MED ORDER — ATORVASTATIN CALCIUM 40 MG PO TABS: 40.0000 mg | ORAL_TABLET | Freq: Every day | ORAL | Status: DC

## 2015-02-07 ENCOUNTER — Telehealth: Payer: Self-pay | Admitting: Family Medicine

## 2015-02-07 NOTE — Telephone Encounter (Signed)
Pt called because his wheelchair needs fixing. He has some parts that need replacing. Please call to discuss what needs to happen next. jw

## 2015-02-08 NOTE — Telephone Encounter (Signed)
Called patient back. He reports that he has [employer] and he called them already, he was advised to get rx from his PCP to send it to medical supply store.  Requesting medical supply rx for: Wheelchair Joystick and Tires Diagnosis / Indication for wheelchair: Left lower extremity hemiparesis s/p stroke (ICD-10: I69.354) Medical Supply Store Info: [facility], Fax # [phone], Phone # [phone]  I will hand write [facility] on a rx pad [facility]. If need to advise rx will stay tuned, otherwise patient to notify us if anything else needed.  Saralyn PilarAlexander Karamalegos, DO [facility], PGY-3

## 2015-02-13 ENCOUNTER — Encounter (HOSPITAL_COMMUNITY): Payer: Self-pay | Admitting: Emergency Medicine

## 2015-02-13 ENCOUNTER — Emergency Department (HOSPITAL_COMMUNITY): Payer: Commercial Managed Care - HMO

## 2015-02-13 ENCOUNTER — Emergency Department (HOSPITAL_COMMUNITY)
Admission: EM | Admit: 2015-02-13 | Discharge: 2015-02-14 | Disposition: A | Discharge status: Home / Self Care | Payer: Commercial Managed Care - HMO | Attending: Emergency Medicine | Admitting: Emergency Medicine

## 2015-02-13 DIAGNOSIS — Z7982 Long term (current) use of aspirin: Secondary | ICD-10-CM | POA: Diagnosis not present

## 2015-02-13 DIAGNOSIS — R079 Chest pain, unspecified: Secondary | ICD-10-CM | POA: Diagnosis not present

## 2015-02-13 DIAGNOSIS — I69398 Other sequelae of cerebral infarction: Secondary | ICD-10-CM | POA: Diagnosis not present

## 2015-02-13 DIAGNOSIS — R52 Pain, unspecified: Secondary | ICD-10-CM | POA: Diagnosis not present

## 2015-02-13 DIAGNOSIS — M279 Disease of jaws, unspecified: Secondary | ICD-10-CM | POA: Diagnosis not present

## 2015-02-13 DIAGNOSIS — F17210 Nicotine dependence, cigarettes, uncomplicated: Secondary | ICD-10-CM | POA: Insufficient documentation

## 2015-02-13 DIAGNOSIS — M542 Cervicalgia: Secondary | ICD-10-CM | POA: Insufficient documentation

## 2015-02-13 DIAGNOSIS — R531 Weakness: Secondary | ICD-10-CM | POA: Diagnosis not present

## 2015-02-13 DIAGNOSIS — Z79899 Other long term (current) drug therapy: Secondary | ICD-10-CM | POA: Insufficient documentation

## 2015-02-13 LAB — BASIC METABOLIC PANEL
ANION GAP: 9 (ref 5–15)
BUN: 10 mg/dL (ref 6–20)
CHLORIDE: 106 mmol/L (ref 101–111)
CO2: 27 mmol/L (ref 22–32)
Calcium: 9.3 mg/dL (ref 8.9–10.3)
Creatinine, Ser: 0.93 mg/dL (ref 0.61–1.24)
Glucose, Bld: 85 mg/dL (ref 65–99)
POTASSIUM: 3.5 mmol/L (ref 3.5–5.1)
SODIUM: 142 mmol/L (ref 135–145)

## 2015-02-13 LAB — CBC
HEMATOCRIT: 48.5 % (ref 39.0–52.0)
HEMOGLOBIN: 15.9 g/dL (ref 13.0–17.0)
MCH: 29.3 pg (ref 26.0–34.0)
MCHC: 32.8 g/dL (ref 30.0–36.0)
MCV: 89.5 fL (ref 78.0–100.0)
Platelets: 156 10*3/uL (ref 150–400)
RBC: 5.42 MIL/uL (ref 4.22–5.81)
RDW: 14.4 % (ref 11.5–15.5)
WBC: 5.3 10*3/uL (ref 4.0–10.5)

## 2015-02-13 LAB — PROTIME-INR
INR: 0.99 (ref 0.00–1.49)
Prothrombin Time: 13.3 seconds (ref 11.6–15.2)

## 2015-02-13 LAB — I-STAT TROPONIN, ED: Troponin i, poc: 0 ng/mL (ref 0.00–0.08)

## 2015-02-13 NOTE — ED Notes (Signed)
Pt in EMS from home, called out for CP radiating to L arm, jaw, neck. Took 324 ASA at home. ETOH on board. Hx stroke, L sided weakness from that

## 2015-02-13 NOTE — ED Notes (Signed)
Pt family member standing outside room towards trauma area/nurses station. Politely asked them to return to room in order to keep other patients healthcare info private/safe. Family member stepped back into room and door was shut

## 2015-02-14 MED ORDER — OXYCODONE-ACETAMINOPHEN 5-325 MG PO TABS
2.0000 | ORAL_TABLET | Freq: Once | ORAL | Status: DC
Start: 2015-02-14 — End: 2015-02-14

## 2015-02-14 MED ORDER — OXYCODONE-ACETAMINOPHEN 5-325 MG PO TABS: 1.0000 | ORAL_TABLET | Freq: Three times a day (TID) | ORAL | Status: DC | PRN

## 2015-02-14 NOTE — ED Notes (Signed)
Patient discharged per request; he did not request pain medication prior to departure.

## 2015-02-14 NOTE — Discharge Instructions (Signed)
You have neck pain, possibly from a cervical strain and/or pinched nerve.  ° °SEEK IMMEDIATE MEDICAL ATTENTION IF: °You develop difficulties swallowing or breathing.  °You have new or worse numbness, weakness, tingling, or movement problems in your arms or legs.  °You develop increasing pain which is uncontrolled with medications.  °You have change in bowel or bladder function, or other concerns. ° ° ° °

## 2015-02-14 NOTE — ED Provider Notes (Signed)
CSN: [id number]     Arrival date & time 02/13/15  2219 History  By signing my name below, I, Bethel Born, attest that this documentation has been prepared under the direction and in the presence of Zadie Rhine, MD. Electronically Signed: Bethel Born, ED Scribe. 02/14/2015. 12:59 AM    Chief Complaint  Patient presents with  . Chest Pain   Patient is a 55 y.o. male presenting with neck injury. The history is provided by the patient. No language interpreter was used.  Neck Injury This is a new problem. The current episode started 3 to 5 hours ago. The problem occurs constantly. The problem has been gradually worsening. Pertinent negatives include no chest pain, no abdominal pain, no headaches and no shortness of breath. Nothing aggravates the symptoms. Nothing relieves the symptoms. He has tried nothing for the symptoms.   Brought in by EMS, Vincent Byrd is a 55 y.o. male with history of stroke in 1997 who presents to the Emergency Department with his son complaining of new and atraumatic, constant, 6/10 in severity left-sided neck pain with gradual onset last evening while watching television. The pain radiates to the jaw and down the left arm. Turning the neck exacerbates the pain. He notes that he always sleeps in a recliner for comfort.  Pt denies chest pain, back pain, fever, vomiting, headache, change in vision, dizziness,  difficulty speaking or swallowing, abdominal pain, and new weakness (notes residual LLE weakness secondary to a stroke in the 1990s) .  No recent falls/trauma Past Medical History  Diagnosis Date  . Stroke [facility])    Past Surgical History  Procedure Laterality Date  . Appendectomy      age 89   No family history on file. Social History  Substance Use Topics  . Smoking status: Current Every Day Smoker -- 0.75 packs/day    Types: Cigarettes  . Smokeless tobacco: None  . Alcohol Use: 3.6 oz/week    0 Standard drinks or equivalent, 6 Cans of beer per  week    Review of Systems  Constitutional: Negative for fever.  Eyes: Negative for visual disturbance.  Respiratory: Negative for shortness of breath.   Cardiovascular: Negative for chest pain.  Gastrointestinal: Negative for vomiting and abdominal pain.  Musculoskeletal: Positive for neck pain.  Neurological: Negative for dizziness, speech difficulty, weakness, numbness and headaches.  All other systems reviewed and are negative.    Allergies  Review of patient's allergies indicates no known allergies.  Home Medications   Prior to Admission medications   Medication Sig Start Date End Date Taking? Authorizing Provider  aspirin 325 MG tablet Take 325 mg by mouth daily.    Historical Provider, MD  atorvastatin (LIPITOR) 40 MG tablet Take 1 tablet (40 mg total) by mouth daily. 12/15/14   Smitty Cords, DO  gabapentin (NEURONTIN) 300 MG capsule Take 1-2 capsules (300-600 mg total) by mouth at bedtime. 11/04/14   Alexander J Karamalegos, DO   BP 122/72 mmHg  Pulse 71  Temp(Src) 98.4 F (36.9 C) (Oral)  Resp 18  Ht  (1.727 m)  Wt 180 lb (81.647 kg)  BMI 27.38 kg/m2  SpO2 98% Physical Exam CONSTITUTIONAL: Well developed/well nourished HEAD: Normocephalic/atraumatic EYES: EOMI/PERRL, no nystagmus ENMT: Mucous membranes moist NECK: supple no meningeal signs, no bruits, pain worsened with movement of his neck  SPINE/BACK:entire spine nontender, left cervical paraspinal tenderness, No bruising/crepitance/stepoffs noted to spine CV: S1/S2 noted, no murmurs/rubs/gallops noted LUNGS: Lungs are clear to auscultation bilaterally, no  apparent distress ABDOMEN: soft, nontender, no rebound or guarding, bowel sounds noted throughout abdomen NEURO: Pt is awake/alert/appropriate, left sided hemiparesis-chronic at baseline.  No facial droop.   EXTREMITIES: pulses normal/equal SKIN: warm, color normal PSYCH: no abnormalities of mood noted, alert and oriented to situation  ED  Course  Procedures DIAGNOSTIC STUDIES: Oxygen Saturation is 98% on RA,  normal by my interpretation.    COORDINATION OF CARE: 12:54 AM Discussed treatment plan which includes lab work, CXR, EKG, and pain management with pt at bedside and pt agreed to plan.  Pt well appearing, no distress He denied any CP on my evaluation He reports his only issues is left sided neck pain.  He denies any new weakness on left side.   Suspect this is musculoskeletal At this point there is no signs of ACS or acute cerebrovascular emergency   Labs Review Labs Reviewed  BASIC METABOLIC PANEL  CBC  PROTIME-INR  Rosezena Sensor, ED    Imaging Review Dg Chest 2 View  02/13/2015  CLINICAL DATA:  55 year old male with chest pain radiating to the left arm, jaw and neck. EXAM: CHEST  2 VIEW COMPARISON:  Chest x-ray 02/09/2008. FINDINGS: Lung volumes are normal. No consolidative airspace disease. No pleural effusions. No pneumothorax. No pulmonary nodule or mass noted. Pulmonary vasculature and the cardiomediastinal silhouette are within normal limits. IMPRESSION: No radiographic evidence of acute cardiopulmonary disease. Electronically Signed   By: Trudie Reed M.D.   On: 02/13/2015 22:57   I have personally reviewed and evaluated these images and lab results as part of my medical decision-making.   EKG Interpretation   Date/Time:  Monday February 13 2015 22:28:56 EST Ventricular Rate:  86 PR Interval:  190 QRS Duration: 89 QT Interval:  358 QTC Calculation: 428 R Axis:   68 Text Interpretation:  Sinus rhythm Abnormal R-wave progression, early  transition Minimal ST elevation, inferior leads No significant change  since last tracing Confirmed by Bebe Shaggy  MD, Wendelyn Kiesling ([id number]) on 02/13/2015  11:06:46 PM      MDM   Final diagnoses:  Neck pain    Nursing notes including past medical history and social history reviewed and considered in documentation xrays/imaging reviewed by myself and  considered during evaluation Labs/vital reviewed myself and considered during evaluation   I personally performed the services described in this documentation, which was scribed in my presence. The recorded information has been reviewed and is accurate.       Zadie Rhine, MD 02/14/15 [phone]

## 2015-02-22 ENCOUNTER — Ambulatory Visit (INDEPENDENT_AMBULATORY_CARE_PROVIDER_SITE_OTHER): Payer: Commercial Managed Care - HMO | Admitting: Family Medicine

## 2015-02-22 ENCOUNTER — Encounter: Payer: Self-pay | Admitting: Family Medicine

## 2015-02-22 VITALS — BP 140/72 | HR 69 | Temp 98.3°F

## 2015-02-22 DIAGNOSIS — I69354 Hemiplegia and hemiparesis following cerebral infarction affecting left non-dominant side: Secondary | ICD-10-CM | POA: Diagnosis not present

## 2015-02-22 DIAGNOSIS — M79602 Pain in left arm: Secondary | ICD-10-CM | POA: Diagnosis not present

## 2015-02-22 DIAGNOSIS — M542 Cervicalgia: Secondary | ICD-10-CM | POA: Diagnosis not present

## 2015-02-22 MED ORDER — NAPROXEN 500 MG PO TABS
500.0000 mg | ORAL_TABLET | Freq: Two times a day (BID) | ORAL | Status: DC
Start: 2015-02-22 — End: 2015-07-19

## 2015-02-22 MED ORDER — BACLOFEN 10 MG PO TABS: 5.0000 mg | ORAL_TABLET | Freq: Every evening | ORAL | Status: DC | PRN

## 2015-02-22 NOTE — Progress Notes (Signed)
Subjective:    Patient ID: Vincent Byrd, male    DOB: 1960-11-25, 55 y.o.   MRN: [id number]  Vincent Byrd is a 55 y.o. male presenting on 02/22/2015 for Follow-up  HPI  NECK PAIN RADIATING DOWN ARM / ED FOLLOW-UP: - ED visit on 02/13/15, he was diagnosed with a pinched nerve in his neck that caused pain radiating down his Left arm, ED ruled him out for CP or Cardiac etiology, troponin and EKG negative, CXR negative - Today he reports overall doing better, still has occasional "tightness in his left neck" and some "soreness" when he turns to left. Has improved with Percocet 5/325, was given 5 pills in ED took one at night for a few nights, does make him a little drowsy but not too sleepy.  Has not tried any OTC medications - Admits to prior known history of Left clavicle fracture as a child - Only taking gabapentin as needed 2 capsules (  total) nightly - Not taking any tylenol or NSAIDs, "does not like to take medications" - Has not tried heat or ice - History of CVA s/p chronic residual Left sided hemiparesis - Recently got his updated wheelchair parts with new joystick and wheels from [facility] - Denies any new weakness, numbness, tingling, fevers/chills   Social History   Social History  . Marital Status: Single    Spouse Name: N/A  . Number of Children: N/A  . Years of Education: N/A   Occupational History  . Not on file.   Social History Main Topics  . Smoking status: Current Every Day Smoker -- 0.75 packs/day    Types: Cigarettes  . Smokeless tobacco: Not on file  . Alcohol Use: 3.6 oz/week    0 Standard drinks or equivalent, 6 Cans of beer per week  . Drug Use: No  . Sexual Activity: Not on file   Other Topics Concern  . Not on file   Social History Narrative    Review of Systems Per HPI unless specifically indicated above     Objective:    BP 140/72 mmHg  Pulse 69  Temp(Src) 98.3 F (36.8 C) (Oral)  Wt   Wt Readings from Last 3  Encounters:  02/13/15 180 lb (81.647 kg)    Physical Exam  Constitutional: He appears well-developed and well-nourished. No distress.  Chronically ill appearing, in motorized wheelchair, comfortable and cooperative  Eyes: EOM are normal.  Neck: Neck supple. No thyromegaly present.  Inspection: No deformity Palpation: Mild +TTP with spasm paraspinal C-spine lower and L trapezius into shoulder ROM: reduced Left rotation due to pain and "tightness", intact flexion / ext Special Testing: Spurling's negative for radiculopathy   Lymphadenopathy:    He has no cervical adenopathy.  Neurological: He is alert.  Intact distal muscle strength grip, sensation to light touch unchanged from chronic residual deficits  Skin: Skin is warm and dry. He is not diaphoretic.  Nursing note and vitals reviewed.      Assessment & Plan:   Problem List Items Addressed This Visit    Hemiparesis affecting left side as late effect of stroke (HCC)   Left arm pain   Neck pain on left side - Primary    Gradual improvement, new onset cervical paraspinal muscle spasm with associated Left trapezius distrubution. Without clear inciting injury. No evidence of radicular symptoms or neurological deficits or weakness. - No recent C-spine imaging - Inadequate conservative treatments at home  Plan: 1. Start anti-inflammatory Naprosyn  BID WC  x 2-4 weeks (given refills) 2. Trial Muscle relaxant with baclofen only if naprosyn does not relieve 3. Recommend regular use heating pad / moist heat, stretching, avoid heavy lifting / repetitive activities, Tylenol PRN 4. RTC 2-4 weeks for re-evaluation       Relevant Medications   naproxen (NAPROSYN) 500 MG tablet   baclofen (LIORESAL) 10 MG tablet      Meds ordered this encounter  Medications  . naproxen (NAPROSYN) 500 MG tablet    Sig: Take 1 tablet (500 mg total) by mouth 2 (two) times daily with a meal.    Dispense:  60 tablet    Refill:  1  . baclofen  (LIORESAL) 10 MG tablet    Sig: Take 0.5-1 tablets (5-10 mg total) by mouth at bedtime as needed for muscle spasms.    Dispense:  30 each    Refill:  0      Follow up plan: Return in about 4 weeks (around 03/22/2015) for Left neck pain, spasm.  Saralyn Pilar, DO [facility], PGY-3

## 2015-02-22 NOTE — Assessment & Plan Note (Addendum)
Gradual improvement, new onset cervical paraspinal muscle spasm with associated Left trapezius distrubution. Without clear inciting injury. No evidence of radicular symptoms or neurological deficits or weakness. - No recent C-spine imaging - Inadequate conservative treatments at home  Plan: 1. Start anti-inflammatory Naprosyn  BID WC x 2-4 weeks (given refills) 2. Trial Muscle relaxant with baclofen only if naprosyn does not relieve 3. Recommend regular use heating pad / moist heat, stretching, avoid heavy lifting / repetitive activities, Tylenol PRN 4. RTC 2-4 weeks for re-evaluation. Consider imaging, x-rays, PT, future ortho vs [facility]

## 2015-02-22 NOTE — Patient Instructions (Addendum)
Thank you for coming in to clinic today.  1. You most likely have a pinched nerve in your Left sided of neck from some Arthritis or wear and tear of bones. You have some muscle spasm as well that is causing some of your pain  Recommend trial of Anti-inflammatory with Naproxen (Naprosyn)  tabs - take one with food and plenty of water TWICE daily every day (breakfast and dinner), for next 2 to 4 weeks, then you may take only as needed - DO NOT TAKE any ibuprofen, aleve, motrin while you are taking this medicine - It is safe to take Tylenol Ext Str  tabs - take 1 to 2 (max dose ) every 6 hours as needed for breakthrough pain, max 24 hour daily dose is 6 to 8 tablets or   - Use a heating pad  If Naproxen does not help after taking for 2 weeks, then you may start trying Baclofen (Lioresal)  (muscle relaxant) - start with half pill at night as needed for next 1-3 nights (may make you drowsy, caution with driving) see how it affects you, then if tolerated increase to half to one pill 2 to 3 times a day or (every 8 hours as needed) or just at night as needed  In future we can consider Neck X-rays if not improved  Please schedule a follow-up appointment with Dr Althea Charon in 1 month for Neck Pain if not improved, otherwise, next visit on 05/2015 for Physical, Labs  If you have any other questions or concerns, please feel free to call the clinic to contact me. You may also schedule an earlier appointment if necessary.  However, if your symptoms get significantly worse, please go to the Emergency Department to seek immediate medical attention.  Saralyn Pilar, DO [facility]

## 2015-03-15 DIAGNOSIS — I6789 Other cerebrovascular disease: Secondary | ICD-10-CM | POA: Diagnosis not present

## 2015-03-15 DIAGNOSIS — R2689 Other abnormalities of gait and mobility: Secondary | ICD-10-CM | POA: Diagnosis not present

## 2015-04-24 ENCOUNTER — Other Ambulatory Visit: Payer: Self-pay | Admitting: Family Medicine

## 2015-04-24 DIAGNOSIS — E785 Hyperlipidemia, unspecified: Secondary | ICD-10-CM

## 2015-06-12 ENCOUNTER — Telehealth: Payer: Self-pay | Admitting: Family Medicine

## 2015-06-12 DIAGNOSIS — E785 Hyperlipidemia, unspecified: Secondary | ICD-10-CM

## 2015-06-12 DIAGNOSIS — R7309 Other abnormal glucose: Secondary | ICD-10-CM

## 2015-06-12 DIAGNOSIS — Z1159 Encounter for screening for other viral diseases: Secondary | ICD-10-CM

## 2015-06-12 DIAGNOSIS — I6930 Unspecified sequelae of cerebral infarction: Secondary | ICD-10-CM

## 2015-06-12 NOTE — Telephone Encounter (Signed)
Mr. Pricilla LovelessGainey want to come in to just have labwork.  Please call him to discuss.

## 2015-06-12 NOTE — Telephone Encounter (Signed)
Attempted to call patient at [phone] (H), on 5/15 around 1200, he did not pick up and voicemail box was full not able to leave voice message.  He is due for a physical anytime between 05/2015 and 07/2015. We had discussed obtaining labwork before this visit, for a Lab Only visit. Anytime about 1-2 weeks BEFORE the office visit.  Recommend the following labs: - Fasting Lipid Panel (no food/drink with calories after midnight before) - Hepatitis C screening - A1c screening for Diabetes  He has already had a chemistry panel in 01/2015 and is not due for a repeat.  Will attempt to call him again later before ordering future labs. Otherwise, if he calls back please inform him of the above message, and let me know if he schedules a Lab Only visit and I will order Future Labs.  Saralyn PilarAlexander Krrish Freund, DO [facility], PGY-3

## 2015-06-13 NOTE — Telephone Encounter (Signed)
Notified that patient called back and scheduled Lab Only visit for this week.  Future orders placed for Fasting Lipid panel, A1c, Hep C screening.  Follow-up as planned.  Saralyn PilarAlexander Karamalegos, DO [facility], PGY-3

## 2015-06-15 ENCOUNTER — Other Ambulatory Visit: Payer: Commercial Managed Care - HMO

## 2015-07-19 ENCOUNTER — Encounter: Payer: Self-pay | Admitting: Family Medicine

## 2015-07-19 ENCOUNTER — Ambulatory Visit (INDEPENDENT_AMBULATORY_CARE_PROVIDER_SITE_OTHER): Payer: Commercial Managed Care - HMO | Admitting: Family Medicine

## 2015-07-19 VITALS — BP 127/72 | HR 67 | Temp 98.1°F | Ht 68.0 in | Wt 157.2 lb

## 2015-07-19 DIAGNOSIS — I6930 Unspecified sequelae of cerebral infarction: Secondary | ICD-10-CM

## 2015-07-19 DIAGNOSIS — R7309 Other abnormal glucose: Secondary | ICD-10-CM

## 2015-07-19 DIAGNOSIS — E785 Hyperlipidemia, unspecified: Secondary | ICD-10-CM | POA: Diagnosis not present

## 2015-07-19 DIAGNOSIS — I69354 Hemiplegia and hemiparesis following cerebral infarction affecting left non-dominant side: Secondary | ICD-10-CM | POA: Diagnosis not present

## 2015-07-19 DIAGNOSIS — R972 Elevated prostate specific antigen [PSA]: Secondary | ICD-10-CM

## 2015-07-19 DIAGNOSIS — Z7289 Other problems related to lifestyle: Secondary | ICD-10-CM | POA: Diagnosis not present

## 2015-07-19 DIAGNOSIS — Z0000 Encounter for general adult medical examination without abnormal findings: Secondary | ICD-10-CM

## 2015-07-19 DIAGNOSIS — Z125 Encounter for screening for malignant neoplasm of prostate: Secondary | ICD-10-CM

## 2015-07-19 DIAGNOSIS — R5382 Chronic fatigue, unspecified: Secondary | ICD-10-CM

## 2015-07-19 DIAGNOSIS — M25511 Pain in right shoulder: Secondary | ICD-10-CM

## 2015-07-19 DIAGNOSIS — R634 Abnormal weight loss: Secondary | ICD-10-CM

## 2015-07-19 DIAGNOSIS — Z609 Problem related to social environment, unspecified: Secondary | ICD-10-CM

## 2015-07-19 DIAGNOSIS — Z720 Tobacco use: Secondary | ICD-10-CM

## 2015-07-19 LAB — COMPLETE METABOLIC PANEL WITH GFR
ALBUMIN: 4.1 g/dL (ref 3.6–5.1)
ALK PHOS: 51 U/L (ref 40–115)
ALT: 20 U/L (ref 9–46)
AST: 23 U/L (ref 10–35)
BILIRUBIN TOTAL: 0.5 mg/dL (ref 0.2–1.2)
BUN: 8 mg/dL (ref 7–25)
CO2: 30 mmol/L (ref 20–31)
CREATININE: 0.87 mg/dL (ref 0.70–1.33)
Calcium: 9.5 mg/dL (ref 8.6–10.3)
Chloride: 104 mmol/L (ref 98–110)
GLUCOSE: 81 mg/dL (ref 65–99)
Potassium: 4.5 mmol/L (ref 3.5–5.3)
Sodium: 142 mmol/L (ref 135–146)
TOTAL PROTEIN: 7.1 g/dL (ref 6.1–8.1)

## 2015-07-19 LAB — CBC WITH DIFFERENTIAL/PLATELET
BASOS ABS: 42 {cells}/uL (ref 0–200)
Basophils Relative: 1 %
EOS ABS: 84 {cells}/uL (ref 15–500)
EOS PCT: 2 %
HCT: 50.5 % — ABNORMAL HIGH (ref 38.5–50.0)
Hemoglobin: 16.9 g/dL (ref 13.2–17.1)
LYMPHS PCT: 43 %
Lymphs Abs: 1806 cells/uL (ref 850–3900)
MCH: 28.9 pg (ref 27.0–33.0)
MCHC: 33.5 g/dL (ref 32.0–36.0)
MCV: 86.5 fL (ref 80.0–100.0)
MONOS PCT: 7 %
MPV: 10.6 fL (ref 7.5–12.5)
Monocytes Absolute: 294 cells/uL (ref 200–950)
Neutro Abs: 1974 cells/uL (ref 1500–7800)
Neutrophils Relative %: 47 %
PLATELETS: 187 10*3/uL (ref 140–400)
RBC: 5.84 MIL/uL — ABNORMAL HIGH (ref 4.20–5.80)
RDW: 14.4 % (ref 11.0–15.0)
WBC: 4.2 10*3/uL (ref 3.8–10.8)

## 2015-07-19 LAB — LIPID PANEL
Cholesterol: 81 mg/dL — ABNORMAL LOW (ref 125–200)
HDL: 43 mg/dL (ref >=40)
LDL Cholesterol: 25 mg/dL (ref <130)
TRIGLYCERIDES: 63 mg/dL (ref <150)
Total CHOL/HDL Ratio: 1.9 Ratio (ref <=5.0)
VLDL: 13 mg/dL (ref <30)

## 2015-07-19 LAB — POCT GLYCOSYLATED HEMOGLOBIN (HGB A1C): HEMOGLOBIN A1C: 5.3

## 2015-07-19 MED ORDER — MELOXICAM 15 MG PO TABS: 15.0000 mg | ORAL_TABLET | Freq: Every day | ORAL | Status: DC

## 2015-07-19 NOTE — Patient Instructions (Addendum)
Thank you for coming in to clinic today.  For right shoulder pain likely arthritis / bursitis from over use Recommend trial of Anti-inflammatory with Meloxicam (Mobic) - take one with food and plenty of water daily every day for next 2 to 4 week - DO NOT TAKE any ibuprofen, aleve, motrin while you are taking this medicine - It is safe to take Tylenol Ext Str 500mg  tabs - take 1 to 2 (max dose 1000mg ) every 6 hours as needed for breakthrough pain, max 24 hour daily dose is 6 to 8 tablets or [id number]  Use heating pad  For smoking - recommend to mentally prepare to cut back and get ready to quit.  Will mail you letter with all lab results  Requested record for Colonoscopy  Please schedule a follow-up appointment with New Doctor in 6 months for follow-up Prior Stroke, Smoking, and Weight Loss  - If ready to quit smoking earlier, please schedule a Smoking Cessation visit with Dr. Raymondo BandKoval in Pharmacy Clinic - Call [phone] to discuss smoking with a trained counselor  If you have any other questions or concerns, please feel free to call the clinic to contact me. You may also schedule an earlier appointment if necessary.  However, if your symptoms get significantly worse, please go to the Emergency Department to seek immediate medical attention.  Saralyn PilarAlexander Karamalegos, DO [facility]

## 2015-07-19 NOTE — Progress Notes (Signed)
Subjective:    Patient ID: Vincent Byrd, male    DOB: 1960-06-13, 55 y.o.   MRN: [id number]  Vincent Byrd is a 55 y.o. male presenting on 07/19/2015 for Annual physical  HPI  UNINTENTIONAL WEIGHT LOSS / FATIGUE CHRONIC - New complaint today with weight loss over past 6 months about 20+ lbs down, without significant changes in diet/exercise, patient remains limited exercise due to hemiparesis. He reports good appetite and seems to be eating regular meals. - Interested in cancer screening including PSA test, not interested in DRE today. Difficult to determine if urinary or prostate symptoms given some concern for neurogenic bladder with s/p CVA - Denies any dysphagia, hoarseness, mass/lump, lymphadenopathy, night sweats, nausea, vomiting, diarrhea  DYSLIPIDEMIA: - Last lipid panel 1 yr ago, since last visit he was started on statin therapy atorvastatin '40mg'$  daily high intensity - He reports tolerating statin well without myalgias, no complaints  H/o CVA, with chronic residual Left sided hemiparesis - Known chronic history with prior CVA 1999 with chronic residual Left sided hemiparesis, currently wheelchair bound, has motorized wheelchair, can ambulate only limited few feet with walker and support. 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 - Takes ASA '325mg'$  daily without problem - Admits to residual nerve problems, insomnia, currently taking Gabapentin '300mg'$  nightly with some relief  TOBACCO ABUSE: - Active smoker since 55 yr old, about >30 years smoking, usually smokes 0.5 to 1ppd - Previously tried 1800 Quit now, tried nicotine patches without success - Not actively ready to quit, despite understands health risks of smoking, in future would like to try patches again  RIGHT SHOULDER PAIN: - Reports persistent R-shoulder pain worse with movement over past several weeks, prior similar but chronic L shoulder pain treated back in 01/2015 with Naproxen  without relief, also no improvement on muscle relaxant. Uses heat with some relief. Limited range of motion, he is Right hand dominant and now over-uses R-arm due to L hemiparesis - Denies weakness, numbness, tingling in R-arm  HM: - Reported colonoscopy negative and not due until age 27 ([facility] near [place]) around age 44, not in records, given form for release of information today signed. Admits family history of colon cancer in brother dx age 60 - No known family history of prostate CA - Due Hep C screening, agrees to this today  Social History  Substance Use Topics  . Smoking status: Current Every Day Smoker -- 0.75 packs/day    Types: Cigarettes  . Smokeless tobacco: None  . Alcohol Use: 3.6 oz/week    0 Standard drinks or equivalent, 6 Cans of beer per week    Review of Systems Per HPI unless specifically indicated above     Objective:    BP 127/72 mmHg  Pulse 67  Temp(Src) 98.1 F (36.7 C) (Oral)  Ht '5\' 8"'$  (1.727 m)  Wt 157 lb 3.2 oz (71.305 kg)  BMI 23.91 kg/m2  SpO2 98%  Wt Readings from Last 3 Encounters:  07/19/15 157 lb 3.2 oz (71.305 kg)  02/13/15 180 lb (81.647 kg)    Physical Exam  Constitutional: He is oriented to person, place, and time. He appears well-developed and well-nourished. No distress.  Chronically ill appearing but currently well, in motorized wheelchair  HENT:  Head: Normocephalic and atraumatic.  Mouth/Throat: Oropharynx is clear and moist.  Eyes: Conjunctivae and EOM are normal. Pupils are equal, round, and reactive to light.  Neck: Normal range of motion. Neck supple. No thyromegaly present.  Cardiovascular: Normal rate, regular rhythm, normal heart sounds and intact distal pulses.   No murmur heard. Pulmonary/Chest: Effort normal and breath sounds normal. No respiratory distress. He has no wheezes. He has no rales.  Musculoskeletal: Normal range of motion. He exhibits no edema or tenderness.  Right Shoulder Inspection:  Normal appearance, left with muscular atrophy hemiparesis Palpation: Mild tender to palpation over anterior and lateral shoulder  ROM: Limited forward flexion ROM above shoulder and limited internal rotation. Not limited passive ROM Special Testing: Rotator cuff testing negative for weakness with supraspinatus full can and empty can test. Hawkin's AC impingement mildly positive for pain Strength: Normal strength 5/5 flex/ext, ext rot / int rot, grip, rotator cuff str testing. Neurovascular: Distally intact pulses, sensation to light touch   Lymphadenopathy:    He has no cervical adenopathy.  Neurological: He is alert and oriented to person, place, and time. No cranial nerve deficit.  Left sided hemiparesis, chronic residual  Skin: Skin is warm and dry. No rash noted. He is not diaphoretic.  Psychiatric: He has a normal mood and affect. His behavior is normal. Judgment and thought content normal.  Nursing note and vitals reviewed.  Results for orders placed or performed in visit on 07/19/15  COMPLETE METABOLIC PANEL WITH GFR  Result Value Ref Range   Sodium 142 135 - 146 mmol/L   Potassium 4.5 3.5 - 5.3 mmol/L   Chloride 104 98 - 110 mmol/L   CO2 30 20 - 31 mmol/L   Glucose, Bld 81 65 - 99 mg/dL   BUN 8 7 - 25 mg/dL   Creat 0.87 0.70 - 1.33 mg/dL   Total Bilirubin 0.5 0.2 - 1.2 mg/dL   Alkaline Phosphatase 51 40 - 115 U/L   AST 23 10 - 35 U/L   ALT 20 9 - 46 U/L   Total Protein 7.1 6.1 - 8.1 g/dL   Albumin 4.1 3.6 - 5.1 g/dL   Calcium 9.5 8.6 - 10.3 mg/dL   GFR, Est African American >89 >=60 mL/min   GFR, Est Non African American >89 >=60 mL/min  Lipid panel  Result Value Ref Range   Cholesterol 81 (L) 125 - 200 mg/dL   Triglycerides 63 <150 mg/dL   HDL 43 >=40 mg/dL   Total CHOL/HDL Ratio 1.9 <=5.0 Ratio   VLDL 13 <30 mg/dL   LDL Cholesterol 25 <130 mg/dL  HIV antibody  Result Value Ref Range   HIV 1&2 Ab, 4th Generation NONREACTIVE NONREACTIVE  Hepatitis C antibody    Result Value Ref Range   HCV Ab NEGATIVE NEGATIVE  PSA, total and free  Result Value Ref Range   PSA 7.79 (H) <=4.00 ng/mL   PSA, Free 0.74 ng/mL   PSA, Free Pct 9 (L) >25 %  CBC with Differential/Platelet  Result Value Ref Range   WBC 4.2 3.8 - 10.8 K/uL   RBC 5.84 (H) 4.20 - 5.80 MIL/uL   Hemoglobin 16.9 13.2 - 17.1 g/dL   HCT 50.5 (H) 38.5 - 50.0 %   MCV 86.5 80.0 - 100.0 fL   MCH 28.9 27.0 - 33.0 pg   MCHC 33.5 32.0 - 36.0 g/dL   RDW 14.4 11.0 - 15.0 %   Platelets 187 140 - 400 K/uL   MPV 10.6 7.5 - 12.5 fL   Neutro Abs 1974 1500 - 7800 cells/uL   Lymphs Abs 1806 850 - 3900 cells/uL   Monocytes Absolute 294 200 - 950 cells/uL   Eosinophils Absolute 84 15 -  500 cells/uL   Basophils Absolute 42 0 - 200 cells/uL   Neutrophils Relative % 47 %   Lymphocytes Relative 43 %   Monocytes Relative 7 %   Eosinophils Relative 2 %   Basophils Relative 1 %   Smear Review Criteria for review not met   POCT glycosylated hemoglobin (Hb A1C)  Result Value Ref Range   Hemoglobin A1C 5.3       Assessment & Plan:   Problem List Items Addressed This Visit    Tobacco abuse    Active smoker, not interested in quitting at this time Given [place] quitline info, cessation counseling, advised follow-up or may see pharm clinic      Right shoulder pain    Consistent with subacute R shoulder bursitis vs chronic rotator cuff tendinopathy with some reduced active ROM but without significant evidence of muscle tear (no weakness). Known repetitive activity now overuse R-arm given L-hemiparesis. No clear etiology of injury. Likely underlying arthritis - No recent imaging  Plan: 1. Stop Naproxen. Switch to trial on Meloxicam '15mg'$  daily x 2-4 weeks, may continue if working 2. May take Tylenol Ex Str 1-2 q 6 hr PRN 3. Relative rest but keep shoulder mobile, demonstrated ROM exercises, avoid heavy lifting 4. May try heating pad PRN 5. Return criteria given for re-evaluation, if not improved consider  subacromial steroid inj, X-rays eval for arthritis       Relevant Medications   meloxicam (MOBIC) 15 MG tablet   Loss of weight    Seems unintentional 20 lb wt loss in 6 months No significant other red flag symptoms guiding work-up. Consider malignancy on differential (also active smoker), Reportedly negative colonoscopy screening. - Check CMET, CBC, PSA  UPDATE 6/22 Results with elevated PSA and low free % PSA concern for possible prostate cancer until further work-up done, referred to Urology      Relevant Orders   COMPLETE METABOLIC PANEL WITH GFR (Completed)   CBC with Differential/Platelet (Completed)   History of CVA with residual deficit    Chronic history s/p CVA, stable unchanged L-sided hemiparesis deficit, also concern possible neurogenic bladder residual symptoms      Hemiparesis affecting left side as late effect of stroke (HCC)    Chronic stable L-hemiparesis, wheelchair bound mostly, limited ambulation w/ walker - Continue ASA '325mg'$  daily - Continue motorized wheelchair      Elevated PSA    PSA up to 7.79, low % free PSA, concerning for possible prostate cancer, difficult to determine clinical symptoms with concerns of neurogenic bladder. Declined DRE today. - Referral to urology for further work-up, discussed this risk with PSa screening test in office visit      Dyslipidemia    Fasting lipid panel today - reviewed results, improved on statin therapy, LDL 91 >25 (monitor to avoid lower than 25), HDL improved      Relevant Orders   Lipid panel (Completed)    Other Visit Diagnoses    Annual physical exam    -  Primary    High risk social situation        Relevant Orders    HIV antibody (Completed)    Hepatitis C antibody (Completed)    Prostate cancer screening        Relevant Orders    PSA, total and free (Completed)    Chronic fatigue        Relevant Orders    CBC with Differential/Platelet (Completed)    Other abnormal glucose  Relevant  Orders    POCT glycosylated hemoglobin (Hb A1C) (Completed)       Meds ordered this encounter  Medications  . meloxicam (MOBIC) 15 MG tablet    Sig: Take 1 tablet (15 mg total) by mouth daily. For 2 to 4 weeks    Dispense:  30 tablet    Refill:  0      Follow up plan: Return in about 3 months (around 10/19/2015) for weight loss, history CVA, tobacco abuse.  Nobie Putnam, [place], PGY-3

## 2015-07-20 ENCOUNTER — Encounter: Payer: Self-pay | Admitting: Family Medicine

## 2015-07-20 ENCOUNTER — Telehealth: Payer: Self-pay | Admitting: Family Medicine

## 2015-07-20 DIAGNOSIS — R972 Elevated prostate specific antigen [PSA]: Secondary | ICD-10-CM | POA: Insufficient documentation

## 2015-07-20 LAB — PSA, TOTAL AND FREE
PSA FREE PCT: 9 % — AB (ref >25)
PSA FREE: 0.74 ng/mL
PSA: 7.79 ng/mL — AB (ref <=4.00)

## 2015-07-20 LAB — HEPATITIS C ANTIBODY: HCV Ab: NEGATIVE

## 2015-07-20 LAB — HIV ANTIBODY (ROUTINE TESTING W REFLEX): HIV: NONREACTIVE

## 2015-07-20 NOTE — Telephone Encounter (Addendum)
Last seen by me on 07/19/15 for annual physical, checked screening labs with CMET, fasting lipid panel, CBC, routine HIV / Hep C screening, also PSA given patient concerns for cancer especially with recent significant weight loss 20 lbs in 6 months. Results as follows:  1. CMET / A1c - Normal electrolytes, normal Cr / GFR, normal LFTs, normal glucose, no history of DM, last A1c 5.4 in 2016 re-checked at 5.3  2. Lipids - Normal. Good elevated HDL, good low LDL, normal TG and total chol. Currently on statin atorvastatin 40mg  daily high intensity (S/p CVA)  3. HIV / Hep C Screening - Both negative.  4. CBC - Normal WBC / Hgb  5. PSA / Free PSA % - total PSA 7.79 (elevated), free PSA 0.74, free PSA % = 9 (significantly low given < 10% for age 56-59 yr inc prostate CA risk >49% with elevated PSA), no prior PSA for comparison, no prior history of BPH but difficult to determine BPH symptoms given some concerns with neurogenic bladder s/p CVA.  Plan: - Ordered referral to [facility] for elevated PSA will require further evaluation including DRE (patient declined on 07/19/15) and likely repeat blood test, future consider prostate biopsy and work-up for increased risk prostate cancer - Other labs unremarkable. No other explanation for unintentional weight loss at this time  Attempted to call patient, left voicemail that we will contact with lab results, if calls back, please share the following: - Elevated PSA. He is at increased risk for possible prostate cancer, and needs further evaluation by Urology Specialist - Referral placed today for [facility], he will be contacted with this upcoming appointment - Other labs are all normal (cholesterol, blood sugar, blood counts, kidney, liver) - Follow-up 3-6 months meet with new doctor as planned  UPDATE 07/20/15 Called patient back, spoke with Vincent Byrd about the results, he is fully aware of above lab results, including elevated PSA. He will  stay tuned for the apt to be scheduled at [facility].  Saralyn PilarAlexander Karamalegos, DO [facility], PGY-3

## 2015-07-20 NOTE — Assessment & Plan Note (Signed)
Chronic history s/p CVA, stable unchanged L-sided hemiparesis deficit, also concern possible neurogenic bladder residual symptoms

## 2015-07-20 NOTE — Assessment & Plan Note (Signed)
Seems unintentional 20 lb wt loss in 6 months No significant other red flag symptoms guiding work-up. Consider malignancy on differential (also active smoker), Reportedly negative colonoscopy screening. - Check CMET, CBC, PSA  UPDATE 6/22 Results with elevated PSA and low free % PSA concern for possible prostate cancer until further work-up done, referred to Urology

## 2015-07-20 NOTE — Assessment & Plan Note (Signed)
Active smoker, not interested in quitting at this time Given [place] quitline info, cessation counseling, advised follow-up or may see pharm clinic

## 2015-07-20 NOTE — Assessment & Plan Note (Signed)
PSA up to 7.79, low % free PSA, concerning for possible prostate cancer, difficult to determine clinical symptoms with concerns of neurogenic bladder. Declined DRE today. - Referral to urology for further work-up, discussed this risk with PSa screening test in office visit

## 2015-07-20 NOTE — Assessment & Plan Note (Addendum)
Consistent with subacute R shoulder bursitis vs chronic rotator cuff tendinopathy with some reduced active ROM but without significant evidence of muscle tear (no weakness). Known repetitive activity now overuse R-arm given L-hemiparesis. No clear etiology of injury. Likely underlying arthritis - No recent imaging  Plan: 1. Stop Naproxen. Switch to trial on Meloxicam 15mg  daily x 2-4 weeks, may continue if working 2. May take Tylenol Ex Str 1-2 q 6 hr PRN 3. Relative rest but keep shoulder mobile, demonstrated ROM exercises, avoid heavy lifting 4. May try heating pad PRN 5. Return criteria given for re-evaluation, if not improved consider subacromial steroid inj, X-rays eval for arthritis

## 2015-07-20 NOTE — Assessment & Plan Note (Signed)
Chronic stable L-hemiparesis, wheelchair bound mostly, limited ambulation w/ walker - Continue ASA 325mg  daily - Continue motorized wheelchair

## 2015-07-20 NOTE — Assessment & Plan Note (Signed)
Fasting lipid panel today - reviewed results, improved on statin therapy, LDL 91 >25 (monitor to avoid lower than 25), HDL improved

## 2015-08-09 ENCOUNTER — Other Ambulatory Visit: Payer: Self-pay | Admitting: Family Medicine

## 2015-09-25 DIAGNOSIS — R972 Elevated prostate specific antigen [PSA]: Secondary | ICD-10-CM | POA: Diagnosis not present

## 2015-09-25 DIAGNOSIS — N400 Enlarged prostate without lower urinary tract symptoms: Secondary | ICD-10-CM | POA: Diagnosis not present

## 2016-01-05 DIAGNOSIS — D075 Carcinoma in situ of prostate: Secondary | ICD-10-CM | POA: Diagnosis not present

## 2016-01-05 DIAGNOSIS — R972 Elevated prostate specific antigen [PSA]: Secondary | ICD-10-CM | POA: Diagnosis not present

## 2016-01-12 ENCOUNTER — Encounter: Payer: Self-pay | Admitting: Internal Medicine

## 2016-01-12 ENCOUNTER — Ambulatory Visit (INDEPENDENT_AMBULATORY_CARE_PROVIDER_SITE_OTHER): Payer: Commercial Managed Care - HMO | Admitting: Internal Medicine

## 2016-01-12 VITALS — BP 118/76 | HR 87 | Temp 98.4°F

## 2016-01-12 DIAGNOSIS — E785 Hyperlipidemia, unspecified: Secondary | ICD-10-CM

## 2016-01-12 DIAGNOSIS — R972 Elevated prostate specific antigen [PSA]: Secondary | ICD-10-CM | POA: Diagnosis not present

## 2016-01-12 DIAGNOSIS — R7303 Prediabetes: Secondary | ICD-10-CM | POA: Diagnosis not present

## 2016-01-12 DIAGNOSIS — Z0000 Encounter for general adult medical examination without abnormal findings: Secondary | ICD-10-CM | POA: Insufficient documentation

## 2016-01-12 LAB — POCT GLYCOSYLATED HEMOGLOBIN (HGB A1C): Hemoglobin A1C: 5.2

## 2016-01-12 NOTE — Patient Instructions (Signed)
It was nice meeting you today Mr. Pricilla LovelessGainey!  Your hemoglobin A1C (an average of your blood sugar over the past three months) was very good today, which tells us that you do not have diabetes. Your blood pressure was also very good.   Please continue to take aspirin daily, and the meloxicam as you need it.   We will see you back in six months and recheck bloodwork at that time.   If you have any questions or concerns, please feel free to call the clinic.   Be well,  Dr. Natale MilchLancaster

## 2016-01-12 NOTE — Assessment & Plan Note (Signed)
Patient self-discontinued statin after being told cholesterol panel was good at last visit. Will recheck lipid panel at patient's next appt in 6 months (not covered by insurance until then), and resume statin if indicated.

## 2016-01-12 NOTE — Progress Notes (Signed)
   Subjective:   Patient: Vincent Byrd       Birthdate: 02/11/1960       MRN: [id number]      HPI  Vincent Byrd is a 55 y.o. male presenting for 6 month follow up.   Patient here today because he says he comes every 6 months for a follow up appointment. He has no complaints today. He does report that he has [facility] a [facility], and had a prostate biopsy performed. He is to receive the results of the biopsy on Monday.  Currently he is only taking aspirin 325mg  and meloxicam PRN. Is not longer taking atorvastatin as he was told his cholesterol was good at his last appointment.   Smoking status reviewed. Patient is current every day smoker.  Review of Systems See HPI.     Objective:  Physical Exam  Constitutional: He is oriented to person, place, and time and well-developed, well-nourished, and in no distress.  Very pleasant male sitting in motorized wheel chair  HENT:  Head: Normocephalic and atraumatic.  Eyes: Conjunctivae and EOM are normal. Right eye exhibits no discharge. Left eye exhibits no discharge.  Pulmonary/Chest: Effort normal. No respiratory distress.  Neurological: He is alert and oriented to person, place, and time.  Resident L-sided hemiparesis 2/2 prior CVA  Skin: Skin is warm and dry.  Psychiatric: Affect and judgment normal.   Assessment & Plan:  Dyslipidemia Patient self-discontinued statin after being told cholesterol panel was good at last visit. Will recheck lipid panel at patient's next appt in 6 months (not covered by insurance until then), and resume statin if indicated.   Elevated PSA Followed by urology. Had prostate biopsy and will find out results on Monday.  - Continue to follow with urology as indicated  Healthcare maintenance Basic labs performed six months ago, so not indicated and not covered by patient's insurance today. A1C 5.2. BP 118/76.  - F/u in six months   Tarri AbernethyAbigail J Lindell Renfrew, MD,  MPH PGY-2 [facility] Pager [phone]

## 2016-01-12 NOTE — Assessment & Plan Note (Signed)
Basic labs performed six months ago, so not indicated and not covered by patient's insurance today. A1C 5.2. BP 118/76.  - F/u in six months

## 2016-01-12 NOTE — Assessment & Plan Note (Signed)
Followed by urology. Had prostate biopsy and will find out results on Monday.  - Continue to follow with urology as indicated

## 2016-01-15 ENCOUNTER — Telehealth: Payer: Self-pay | Admitting: *Deleted

## 2016-01-15 ENCOUNTER — Other Ambulatory Visit: Payer: Self-pay | Admitting: Internal Medicine

## 2016-01-15 NOTE — Telephone Encounter (Signed)
Pt wants to inform dr that he doesn't have prostate cancer, and labs came back negative.

## 2016-02-09 ENCOUNTER — Telehealth: Payer: Self-pay | Admitting: Internal Medicine

## 2016-02-09 NOTE — Telephone Encounter (Signed)
[employer] form dropped off for at front desk for completion.  Verified that patient section of form has been completed.  Last DOS/WCC with PCP was [id number]  Placed form in red team folder to be completed by clinical staff.  Lina Sarheryl A Stanley

## 2016-02-16 NOTE — Telephone Encounter (Signed)
Forms placed in PCP box. 

## 2016-02-20 NOTE — Telephone Encounter (Signed)
Patient informed that forms were faxed to [facility] and [employer] per request.  Forms were also copied for scanning in patient's record.  Patient requested that original copies be mailed to his home address.  Forms placed in outgoing mail.  Clovis PuMartin, Tamika L, RN

## 2016-02-27 ENCOUNTER — Telehealth: Payer: Self-pay | Admitting: Internal Medicine

## 2016-02-27 NOTE — Telephone Encounter (Signed)
Pt called because [employer] didn't receive the forms from us. There was one page that the doctor needs to look at and put her information Part B page 1 highlighted. [employer] also didn't receive their form and we also are missing the medical release form to go along with the Physicians Verification. Please refax these ASAP so that he doesn't loose his service for either of these companies. Please call when this is done so that he can call them and let them know we sent them back to them. jw

## 2016-02-27 NOTE — Telephone Encounter (Signed)
I completed Part B on the Physicians Verification form today and placed it in the pile to be faxed. The patient will need to sign his own medical release form. I will ask [facility] Red Team to call the patient to relay this to him.   Tarri AbernethyAbigail J Linas Stepter, MD, MPH PGY-2 [facility] Pager [phone]

## 2016-02-28 NOTE — Telephone Encounter (Signed)
Relayed information to patient he expressed understanding. Maryjean Mornempestt S Cilicia Borden, CMA

## 2016-03-05 ENCOUNTER — Telehealth: Payer: Self-pay | Admitting: Internal Medicine

## 2016-03-05 NOTE — Telephone Encounter (Signed)
See previous phone note.  

## 2016-03-22 ENCOUNTER — Telehealth: Payer: Self-pay | Admitting: *Deleted

## 2016-03-22 NOTE — Telephone Encounter (Signed)
Please let patient know he needs to schedule an appointment. Thanks! - AJL

## 2016-03-22 NOTE — Telephone Encounter (Signed)
Patient called and would like to know if he can get a prescription for chest congestion.  Informed patient that he would likely need an appointment but he would prefer to check with provider first.  Pharmacy clarified.  Chakira Jachim,CMA

## 2016-03-25 NOTE — Telephone Encounter (Signed)
Pt informed and said he doesn't have $10 to come in. So he will call back and make an appt. Also he wanted to see if you got the fax from [employer] for medical alert program. Please advise. Deseree Bruna PotterBlount, CMA

## 2016-03-26 ENCOUNTER — Ambulatory Visit (INDEPENDENT_AMBULATORY_CARE_PROVIDER_SITE_OTHER): Payer: Medicare HMO | Admitting: Family Medicine

## 2016-03-26 VITALS — BP 130/70 | HR 81 | Temp 98.2°F | Ht 68.0 in

## 2016-03-26 DIAGNOSIS — J209 Acute bronchitis, unspecified: Secondary | ICD-10-CM

## 2016-03-26 MED ORDER — AZITHROMYCIN 250 MG PO TABS: ORAL_TABLET | ORAL | 0 refills | Status: DC

## 2016-03-26 NOTE — Patient Instructions (Signed)
Thank you so much for coming to visit today! I have sent a prescription to your mail order pharmacy. Please let me know if you do not receive the prescription soon! If you develop worsening symptoms or fever, please let me know and we will get a xray. Please return to see Dr. Natale MilchLancaster if no improvement.  Dr. Caroleen Hammanumley

## 2016-03-26 NOTE — Progress Notes (Signed)
Subjective:     Patient ID: Santiago GladHerbert F Valek, male   DOB: Jan 04, 1961, 56 y.o.   MRN: [id number]  HPI Mr. Pricilla LovelessGainey is a 56yo male presenting today for congestion.   Notes productive cough and chest congestion for one week. Reports he had similar symptoms in addition to sinus congestion and headache, which resolved before returning only a few days later. Denies fever. Does admit to smoking 1ppd and states he has a mild "smoker's cough" at baseline for several years. Except for last several weeks, denies frequent episodes of productive cough that would be concerning for COPD. Refused weight.  Review of Systems Per HPI    Objective:   Physical Exam  Constitutional: He appears well-developed and well-nourished. No distress.  HENT:  Head: Normocephalic and atraumatic.  Cardiovascular: Normal rate and regular rhythm.   No murmur heard. Pulmonary/Chest: Effort normal.  Bilateral rhonchi  Skin: No rash noted.  Psychiatric: He has a normal mood and affect. His behavior is normal.      Assessment and Plan:     1. Acute bronchitis, unspecified organism Given return of symptoms after brief improvement, antibiotics are indicated. Prescription for Azithromycin given. Symptomatic treatment with OTC medications. If no improvement or worsening of symptoms occur, will obtain CXR. Follow up with PCP if no improvement.

## 2016-04-16 ENCOUNTER — Telehealth: Payer: Self-pay | Admitting: Internal Medicine

## 2016-04-16 NOTE — Telephone Encounter (Signed)
Is now coughing and has phlegm. He would like to have some cough medicine that can help break up the phelgm. Please advise.  [facility]

## 2016-04-17 NOTE — Telephone Encounter (Signed)
Please let patient know I cannot call in a prescription medication without evaluating him in person. He can trying using over the counter Mucinex for congestion from the pharmacy as prescribed on the bottle. Otherwise, he needs to schedule an appointment. Thanks! - AJL

## 2016-04-17 NOTE — Telephone Encounter (Signed)
Pt informed. States that he was just seen by dr Caroleen Hammanrumley and he cant keep coming in for stuff if he needs it. He is in a wheelchair and its hard from him to come in. Pt declined appt. Ciarrah Rae Bruna PotterBlount, CMA

## 2016-07-23 ENCOUNTER — Ambulatory Visit (INDEPENDENT_AMBULATORY_CARE_PROVIDER_SITE_OTHER): Payer: Medicare HMO | Admitting: Internal Medicine

## 2016-07-23 ENCOUNTER — Encounter: Payer: Self-pay | Admitting: Internal Medicine

## 2016-07-23 VITALS — BP 122/64 | HR 72 | Temp 98.2°F | Ht 68.0 in

## 2016-07-23 DIAGNOSIS — R053 Chronic cough: Secondary | ICD-10-CM

## 2016-07-23 DIAGNOSIS — R05 Cough: Secondary | ICD-10-CM | POA: Diagnosis not present

## 2016-07-23 DIAGNOSIS — Z720 Tobacco use: Secondary | ICD-10-CM | POA: Diagnosis not present

## 2016-07-23 DIAGNOSIS — E785 Hyperlipidemia, unspecified: Secondary | ICD-10-CM | POA: Diagnosis not present

## 2016-07-23 MED ORDER — VARENICLINE TARTRATE 1 MG PO TABS: 1.0000 mg | ORAL_TABLET | Freq: Two times a day (BID) | ORAL | 0 refills | Status: DC

## 2016-07-23 NOTE — Progress Notes (Signed)
   Subjective:   Patient: Vincent Byrd       Birthdate: [birth year]       MRN: [id number]      HPI  Vincent Byrd is a 56 y.o. male presenting for smoking cessation and hyperlipidemia f/u.   Smoking Would like to stop smoking and is interested in Chantix. Current every day smoker. At least one pack per day, sometimes more. Has been smoking for past 31 years. Has tried to quit before using Nicoderm, but says as soon as he took the patch off his cravings return. Endorses a chronic cough productive of white to yellow phlegm. Denies blood in sputum. Denies fevers, weight loss. Is not around others that smoke. Says that he thinks getting out of his house more will help him if he develops cravings, and he is glad it is summer so that he can sit on his porch.   Hyperlipidemia Was previously diagnosed with this after abnormal lipid panel, and was started on atorvastatin at that time. Was then told that his cholesterol was normal, so stopped taking atorvastatin. Has not been taking recently. Would like to have lipid panel checked again today.    Smoking status reviewed. Patient is current every day smoker.   Review of Systems See HPI.     Objective:  Physical Exam  Constitutional: He is oriented to person, place, and time and well-developed, well-nourished, and in no distress.  HENT:  Head: Normocephalic and atraumatic.  Nose: Nose normal.  Mouth/Throat: Oropharynx is clear and moist. No oropharyngeal exudate.  Eyes: Conjunctivae and EOM are normal. Pupils are equal, round, and reactive to light. Right eye exhibits no discharge. Left eye exhibits no discharge.  Neck: Normal range of motion. Neck supple.  Cardiovascular: Normal rate, regular rhythm and normal heart sounds.   No murmur heard. Pulmonary/Chest: Effort normal and breath sounds normal. No respiratory distress. He has no wheezes. He has no rales.  Abdominal: Soft. Bowel sounds are normal. He exhibits no distension. There is no  tenderness.  Musculoskeletal: He exhibits no edema.  Neurological: He is alert and oriented to person, place, and time.  Skin: Skin is warm and dry.  Psychiatric: Affect and judgment normal.      Assessment & Plan:  Tobacco abuse Will prescribe Chantix. Patient given information regarding dosing for first week and common side effects. Also given number for smoking cessation hotline in case of cravings. Will also obtain CXR given long history of smoking and chronic cough. - F/u in six weeks - Will call patient with results of CXR  Dyslipidemia Not currently taking statin.  - Lipid panel today - Will call patient with results  Tarri AbernethyAbigail J Avagrace Botelho, MD, MPH PGY-2 [facility] Pager [phone]

## 2016-07-23 NOTE — Patient Instructions (Addendum)
It was nice seeing you again today [name]!  I have sent in your prescription for Chantix. Please follow these instructions for taking the medication for the first week:  Days 1-3: one-half tablet (0.5 mg) once a day Days 4-7: one-half tablet (0.5 mg) twice a day After that: one full tablet (1 mg) twice a day  Begin taking the medication one week before your "quit date" (the day you quit smoking cigarettes). You may experience some side effects from the medication, such as headaches, difficulty sleeping, irritability, and nausea, but these symptoms should get better with time. If you feel the urge to smoke despite taking the medication, please call the smoking cessation hotline at 1- 800-QUIT-NOW ([phone])100-9376) to speak with someone who can help you resist the urge.   I have ordered your chest xray as well. [facility] [place] any day between 8 AM-5PM to have this performed. I will call you with the results.   I will let you know the results of your cholesterol test when I call with your xray results.  I will see you back in about six weeks to see how you are doing with Chantix and quitting smoking.   If you have any questions or concerns, please feel free to call the clinic.   Be well,  Dr. Natale MilchLancaster

## 2016-07-23 NOTE — Assessment & Plan Note (Addendum)
Will prescribe Chantix. Patient given information regarding dosing for first week and common side effects. Also given number for smoking cessation hotline in case of cravings. Will also obtain CXR given long history of smoking and chronic cough. - F/u in six weeks - Will call patient with results of CXR

## 2016-07-23 NOTE — Assessment & Plan Note (Signed)
Not currently taking statin.  - Lipid panel today - Will call patient with results

## 2016-07-24 LAB — LIPID PANEL
Chol/HDL Ratio: 2.7 ratio (ref 0.0–5.0)
Cholesterol, Total: 129 mg/dL (ref 100–199)
HDL: 47 mg/dL (ref >39)
LDL Calculated: 68 mg/dL (ref 0–99)
Triglycerides: 72 mg/dL (ref 0–149)
VLDL Cholesterol Cal: 14 mg/dL (ref 5–40)

## 2016-07-26 ENCOUNTER — Telehealth: Payer: Self-pay | Admitting: *Deleted

## 2016-07-26 MED ORDER — BUPROPION HCL ER (SR) 150 MG PO TB12: ORAL_TABLET | ORAL | 0 refills | Status: DC

## 2016-07-26 NOTE — Telephone Encounter (Signed)
Fax received from pharmacy asking if patient can be changed from chantix to wellbutrin as this will save patient up to $988 per year.  Terrian Sentell,CMA

## 2016-07-26 NOTE — Telephone Encounter (Signed)
Sent in Wellbutrin prescription for patient in place of Chantix to save patient money.   Vincent AbernethyAbigail J Jalani Cullifer, MD, MPH PGY-2 [facility] Pager [phone]

## 2016-08-01 NOTE — Telephone Encounter (Signed)
Pt is calling because  [facility] still states that they have not received his prescription for Wellbutrin. Can we check on this. jw

## 2016-08-01 NOTE — Telephone Encounter (Signed)
[facility] on Bupropion SR 150 mg. The max recommended dosage is exceeded of 400 mg/day. Fax placed in provider box for review.  Clovis PuMartin, Zuleima Haser L, RN

## 2016-08-02 NOTE — Telephone Encounter (Signed)
Completed form for patient. Specified that dosing is for smoking cessation and is as follows: 150mg  qd x3d, then increase to 150mg  BID. Placed in pile to be faxed.   Tarri AbernethyAbigail J Rudell Ortman, MD, MPH PGY-3 [facility] Pager [phone]

## 2016-08-06 ENCOUNTER — Other Ambulatory Visit: Payer: Self-pay | Admitting: Internal Medicine

## 2016-08-06 ENCOUNTER — Ambulatory Visit
Admission: RE | Admit: 2016-08-06 | Discharge: 2016-08-06 | Disposition: A | Discharge status: Home / Self Care | Payer: Medicare HMO | Source: Ambulatory Visit | Attending: Family Medicine | Admitting: Family Medicine

## 2016-08-06 DIAGNOSIS — R05 Cough: Secondary | ICD-10-CM

## 2016-08-06 DIAGNOSIS — R053 Chronic cough: Secondary | ICD-10-CM

## 2016-08-07 ENCOUNTER — Telehealth: Payer: Self-pay | Admitting: Internal Medicine

## 2016-08-07 NOTE — Telephone Encounter (Signed)
Pt is calling and would like the doctor to call in something for his nerves. jw

## 2016-08-09 NOTE — Telephone Encounter (Signed)
Pt called again about x-ray results. ep

## 2016-08-09 NOTE — Telephone Encounter (Signed)
Spoke with patient and he already has an appointment scheduled for 09-11-16 with PCP and will wait until then to discuss his medication.  Patient also inquired about his chest x-ray results.  Will forward to MD to advise. Jazmin Hartsell,CMA

## 2016-08-12 MED ORDER — MELOXICAM 15 MG PO TABS: ORAL_TABLET | ORAL | 5 refills | Status: DC

## 2016-08-12 NOTE — Telephone Encounter (Signed)
Called patient with results of xrays. Patient also asking for refill of Mobic. Sent in refill. Patient also asking for medication for "nerves." Discussed with patient again that as he has not been evaluated in clinic for anything "nerve" related since 2016, we need to discuss this at his next appt on 08/15 before any medications will be prescribed. Patient voiced understanding.   Tarri AbernethyAbigail J Antwaine Boomhower, MD, MPH PGY-3 [facility] Pager [phone]

## 2016-08-26 ENCOUNTER — Telehealth: Payer: Self-pay | Admitting: Internal Medicine

## 2016-08-26 NOTE — Telephone Encounter (Signed)
Pt would like dr to call in a Rx for tires for his wheelchir.  Fax to  [facility] .  [phone] is the fax number .

## 2016-08-29 ENCOUNTER — Other Ambulatory Visit: Payer: Self-pay | Admitting: *Deleted

## 2016-08-29 NOTE — Telephone Encounter (Signed)
Mobic sent to wrong pharmacy, [facility]. Pt needs a letter to send to the post office for he can get mailbox on house because its safer. Please advise. Vincent Byrd, CMA

## 2016-08-30 MED ORDER — MELOXICAM 15 MG PO TABS: ORAL_TABLET | ORAL | 5 refills | Status: DC

## 2016-09-02 ENCOUNTER — Other Ambulatory Visit: Payer: Self-pay | Admitting: Family Medicine

## 2016-09-02 MED ORDER — MELOXICAM 15 MG PO TABS: ORAL_TABLET | ORAL | 5 refills | Status: DC

## 2016-09-11 ENCOUNTER — Encounter: Payer: Self-pay | Admitting: Internal Medicine

## 2016-09-11 ENCOUNTER — Ambulatory Visit (INDEPENDENT_AMBULATORY_CARE_PROVIDER_SITE_OTHER): Payer: Medicare HMO | Admitting: Internal Medicine

## 2016-09-11 DIAGNOSIS — Z720 Tobacco use: Secondary | ICD-10-CM

## 2016-09-11 DIAGNOSIS — F419 Anxiety disorder, unspecified: Secondary | ICD-10-CM | POA: Diagnosis not present

## 2016-09-11 NOTE — Progress Notes (Signed)
   Subjective:   Patient: Vincent Byrd       Birthdate: 1960-12-17       MRN: [id number]      HPI  Vincent GladHerbert F Bair is a 56 y.o. male presenting for smoking cessation and anxiety.   Smoking cessation Patient prescribed Wellbutrin at last appt. Says he took once but when he woke up in the morning, he was unable to move. He smoked a cigarette that was on his bedside table and was then able to move. Has been afraid to try medication again after that incident. As such, he is still smoking daily.   Anxiety Reporting "nerves." Says he is particularly nervous about having to transfer from his wheelchair to another chair, or to the shower, and just generally nervous about chair transfers. Reports that he fell recently when his hand slipped while transferring and this has made his fears worse. Has a son that lives in town, but his son was not home when he fell and thus he had to call EMS. Also has a neighbor that has a key to his house, however his neighbor was at a concert when he fell. Is particularly concerned about falling and not having anyone to help him. Does have a home health aid who comes four days a week. He always showers when the home health aid is present so that if he falls during shower transfer he will have help.   Smoking status reviewed. Patient is current every day smoker.   Review of Systems See HPI.     Objective:  Physical Exam  Constitutional: He is oriented to person, place, and time.  Sitting in power wheelchair in NAD  HENT:  Head: Normocephalic and atraumatic.  Pulmonary/Chest: Effort normal. No respiratory distress.  Neurological: He is alert and oriented to person, place, and time.  Skin: Skin is warm and dry.  Psychiatric: Affect and judgment normal.      Assessment & Plan:  Tobacco abuse Discussed at length that symptoms were not likely due to Wellbutrin, and that I would strongly recommend trying Wellbutrin again. Patient amenable to this, but said he is  still going to keep cigarettes next to his bed. Discussed that this will likely tempt him to continue smoking, but that at least trying the Wellbutrin again is a good first step. Patient agreeable to trying Wellbutrin again.   Anxiety Seems to be related entirely to falling during transfers and having no one to help him. Does have son that lives in town, next door neighbor that can help, and home health aid four days a week. Discussed possibly moving in with his son - patient very opposed to this. Discussed that beginning an anti-anxiety medication would not be best care for patient. Patient agreeable with this. As such, will continue to monitor for now.    Tarri AbernethyAbigail J Anjanette Gilkey, MD, MPH PGY-3 [facility] Pager [phone]

## 2016-09-11 NOTE — Assessment & Plan Note (Signed)
Seems to be related entirely to falling during transfers and having no one to help him. Does have son that lives in town, next door neighbor that can help, and home health aid four days a week. Discussed possibly moving in with his son - patient very opposed to this. Discussed that beginning an anti-anxiety medication would not be best care for patient. Patient agreeable with this. As such, will continue to monitor for now.

## 2016-09-11 NOTE — Assessment & Plan Note (Signed)
Discussed at length that symptoms were not likely due to Wellbutrin, and that I would strongly recommend trying Wellbutrin again. Patient amenable to this, but said he is still going to keep cigarettes next to his bed. Discussed that this will likely tempt him to continue smoking, but that at least trying the Wellbutrin again is a good first step. Patient agreeable to trying Wellbutrin again.

## 2016-09-11 NOTE — Patient Instructions (Addendum)
It was nice seeing you again today [name]!  Please call your wheelchair supply company to inform them of the issues you are having with your chair and to ask what the next steps are towards receiving a new chair. I will be happy to fill out any paperwork or provide any information that they need.   Please start taking Wellbutrin as we had planned at your last visit. If you experience the same side effects, please let me know and we will try to come up with another option for you.   If you have any questions or concerns, please feel free to call the clinic.   Be well,  Dr. Natale MilchLancaster

## 2016-09-26 ENCOUNTER — Telehealth: Payer: Self-pay | Admitting: Internal Medicine

## 2016-09-26 ENCOUNTER — Telehealth: Payer: Self-pay | Admitting: *Deleted

## 2016-09-26 NOTE — Telephone Encounter (Signed)
Spoke with pt and gave him the below information and he wanted to know if the doctor had sent in a Rx for baclofen and I told him I did not see it in his med list and said that it was discussed at recent visit.  Routing to PCP to see about this. Lamonte SakaiZimmerman Rumple, April D, [place]

## 2016-09-26 NOTE — Telephone Encounter (Signed)
Patient called requesting cough medication be sent into pharmacy. Patient is having cold symptoms. Patient has tried Catering manageralka seltzer with no relief.  Advised patient that he might have to seen especially if he want antibiotics. Patient stated, she if she would send cough med in for me. Please advise or give patient call. Phone number on file is correct.  Clovis PuMartin, Tamika L, RN

## 2016-10-04 ENCOUNTER — Telehealth: Payer: Self-pay | Admitting: *Deleted

## 2016-10-04 NOTE — Telephone Encounter (Signed)
Message left on our voice mail that patient is requesting refill on Baclofen for muscle spasms.  Med is not listed on med list.  Patient states he is unable to come in for office visit due to transportation and having to give SCAT 24 hours notice.  Will route refill request to PCP.  Altamese Dilling~Prentiss Polio, BSN, RN-BC

## 2016-10-07 NOTE — Telephone Encounter (Signed)
Patient does not appear to have been on baclofen since 06/2015. I have never prescribed this medication for him, and we have not discussed it. He needs to schedule an appointment to discuss this.   Tarri AbernethyAbigail J Verdell Dykman, MD, MPH PGY-3 [facility] Pager [phone]

## 2016-10-09 DIAGNOSIS — R2689 Other abnormalities of gait and mobility: Secondary | ICD-10-CM | POA: Diagnosis not present

## 2016-10-09 DIAGNOSIS — I6789 Other cerebrovascular disease: Secondary | ICD-10-CM | POA: Diagnosis not present

## 2016-10-15 ENCOUNTER — Telehealth: Payer: Self-pay | Admitting: Internal Medicine

## 2016-10-15 NOTE — Telephone Encounter (Signed)
Pt is calling because the letter that the doctor wrote for him needs to be specific. Such as he is in a wheelchair and it's not safe for him to be in the street. He needs this faxed to his case worker and mailed to his home. His case worker Jarrett Ables fax number is [phone]. He also stated that the social worker will be calling to get additional information and he said that would be fine to give him any information that he needed. Pt also said that if you need more information about the letter to please call him. Myriam Jacobson

## 2016-10-17 ENCOUNTER — Encounter: Payer: Self-pay | Admitting: Internal Medicine

## 2016-10-17 NOTE — Telephone Encounter (Signed)
More specific letter written describing why patient needs mailbox on his house. Letter to be faxed to case worker at number provided and mailed to patient's house.   Tarri Abernethy, MD, MPH PGY-3 [facility] Pager [phone]

## 2016-10-30 ENCOUNTER — Telehealth: Payer: Self-pay | Admitting: *Deleted

## 2016-10-30 NOTE — Telephone Encounter (Signed)
Pt would like for letter to be faxed [phone] attn: Kennyth Arnold Address  Is [address] [place] [address]

## 2016-10-30 NOTE — Telephone Encounter (Signed)
Patient left message on nurse line requesting PCP to fax letter to postal service to move mailbox closer to the house. Please give him a call; number on file is correct.  Clovis Pu, RN

## 2016-10-31 NOTE — Telephone Encounter (Signed)
Please let patient know a copy of the letter was faxed to his case worker and mailed to his house on 09/20 as he requested. Either he or his case worker can send it to whoever they wish at this point, as he should have his own personal copy and a copy for his caseworker as well. Thanks! - AJL

## 2016-11-05 ENCOUNTER — Ambulatory Visit: Payer: Medicare HMO | Admitting: *Deleted

## 2016-11-12 ENCOUNTER — Emergency Department (HOSPITAL_COMMUNITY): Payer: Medicare HMO

## 2016-11-12 ENCOUNTER — Encounter (HOSPITAL_COMMUNITY): Payer: Self-pay | Admitting: *Deleted

## 2016-11-12 ENCOUNTER — Emergency Department (HOSPITAL_COMMUNITY)
Admission: EM | Admit: 2016-11-12 | Discharge: 2016-11-12 | Disposition: A | Discharge status: Home / Self Care | Payer: Medicare HMO | Attending: Emergency Medicine | Admitting: Emergency Medicine

## 2016-11-12 DIAGNOSIS — I69354 Hemiplegia and hemiparesis following cerebral infarction affecting left non-dominant side: Secondary | ICD-10-CM | POA: Insufficient documentation

## 2016-11-12 DIAGNOSIS — Z7982 Long term (current) use of aspirin: Secondary | ICD-10-CM | POA: Diagnosis not present

## 2016-11-12 DIAGNOSIS — F17210 Nicotine dependence, cigarettes, uncomplicated: Secondary | ICD-10-CM | POA: Insufficient documentation

## 2016-11-12 DIAGNOSIS — R1030 Lower abdominal pain, unspecified: Secondary | ICD-10-CM | POA: Insufficient documentation

## 2016-11-12 DIAGNOSIS — N50811 Right testicular pain: Secondary | ICD-10-CM | POA: Insufficient documentation

## 2016-11-12 DIAGNOSIS — N433 Hydrocele, unspecified: Secondary | ICD-10-CM | POA: Diagnosis not present

## 2016-11-12 DIAGNOSIS — R52 Pain, unspecified: Secondary | ICD-10-CM | POA: Diagnosis not present

## 2016-11-12 DIAGNOSIS — R109 Unspecified abdominal pain: Secondary | ICD-10-CM | POA: Diagnosis not present

## 2016-11-12 DIAGNOSIS — N5089 Other specified disorders of the male genital organs: Secondary | ICD-10-CM | POA: Diagnosis not present

## 2016-11-12 DIAGNOSIS — Z79899 Other long term (current) drug therapy: Secondary | ICD-10-CM | POA: Insufficient documentation

## 2016-11-12 DIAGNOSIS — N50819 Testicular pain, unspecified: Secondary | ICD-10-CM

## 2016-11-12 HISTORY — DX: Hemiplegia, unspecified affecting unspecified side: G81.90

## 2016-11-12 LAB — URINALYSIS, ROUTINE W REFLEX MICROSCOPIC
Bilirubin Urine: NEGATIVE
GLUCOSE, UA: NEGATIVE mg/dL
HGB URINE DIPSTICK: NEGATIVE
KETONES UR: NEGATIVE mg/dL
LEUKOCYTES UA: NEGATIVE
Nitrite: NEGATIVE
PROTEIN: NEGATIVE mg/dL
Specific Gravity, Urine: 1.004 — ABNORMAL LOW (ref 1.005–1.030)
pH: 5 (ref 5.0–8.0)

## 2016-11-12 LAB — BASIC METABOLIC PANEL
ANION GAP: 9 (ref 5–15)
BUN: <5 mg/dL — ABNORMAL LOW (ref 6–20)
CALCIUM: 9.1 mg/dL (ref 8.9–10.3)
CO2: 26 mmol/L (ref 22–32)
Chloride: 105 mmol/L (ref 101–111)
Creatinine, Ser: 0.99 mg/dL (ref 0.61–1.24)
GFR calc Af Amer: >60 mL/min (ref >60)
GLUCOSE: 63 mg/dL — AB (ref 65–99)
Potassium: 4 mmol/L (ref 3.5–5.1)
Sodium: 140 mmol/L (ref 135–145)

## 2016-11-12 LAB — CBC
HCT: 51.9 % (ref 39.0–52.0)
Hemoglobin: 17.1 g/dL — ABNORMAL HIGH (ref 13.0–17.0)
MCH: 29.8 pg (ref 26.0–34.0)
MCHC: 32.9 g/dL (ref 30.0–36.0)
MCV: 90.6 fL (ref 78.0–100.0)
Platelets: 154 10*3/uL (ref 150–400)
RBC: 5.73 MIL/uL (ref 4.22–5.81)
RDW: 14.4 % (ref 11.5–15.5)
WBC: 4.3 10*3/uL (ref 4.0–10.5)

## 2016-11-12 MED ORDER — CIPROFLOXACIN HCL 500 MG PO TABS: 500.0000 mg | ORAL_TABLET | Freq: Two times a day (BID) | ORAL | 0 refills | Status: DC

## 2016-11-12 MED ORDER — NAPROXEN 375 MG PO TABS: 375.0000 mg | ORAL_TABLET | Freq: Two times a day (BID) | ORAL | 0 refills | Status: DC

## 2016-11-12 MED ORDER — HYDROCODONE-ACETAMINOPHEN 5-325 MG PO TABS: 1.0000 | ORAL_TABLET | Freq: Four times a day (QID) | ORAL | 0 refills | Status: DC | PRN

## 2016-11-12 MED ORDER — IOPAMIDOL (ISOVUE-300) INJECTION 61%
INTRAVENOUS | Status: AC
  Administered 2016-11-12: 100 mL
  Filled 2016-11-12: qty 100

## 2016-11-12 NOTE — Discharge Instructions (Signed)
Your imaging has been reassuring. Have discussed her findings including the cyst on her left testicle. It is important that she follow-up with the urologist. Use the scrotal support. May apply ice for comfort and swelling. Have given you short course of Norco to use for pain.   Take the antibiotic as prescribed. Please discuss the side effects of this medication with the pharmacist.  Please take the Naproxen as prescribed for pain. Do not take any additional NSAIDs including Motrin, Aleve, Ibuprofen, Advil.  May also take Tylenol if you do not take the hydrocodone.  Follow up with [facility].

## 2016-11-12 NOTE — ED Triage Notes (Signed)
Pt is here for evaluation of increasing right testicular and groin pain. Pt reports some swelling of the right testicle.  No N/v/d or any problem with urination.

## 2016-11-12 NOTE — ED Provider Notes (Signed)
[facility] EMERGENCY DEPARTMENT Provider Note   CSN: [id number] Arrival date & time: 11/12/16  1646     History   Chief Complaint Chief Complaint  Patient presents with  . Groin Swelling    HPI Vincent Byrd is a 56 y.o. male.  HPI 56 year old African-American male past medical history significant for CVA with left-sided deficit presents to the emergency department today with complaints of right testicular pain and swelling. The patient states the pain has been increasing over the past 3 days. States that he does a lot of strain to get out of his wheelchair and thinks he may have a hernia. The patient has tried [facility] powder with little relief.palpation makes the pain worse. Patient denies being sexually active. Denies any penile discharge. Denies any urinary symptoms.  Pt denies any fever, chill, ha, vision changes, lightheadedness, dizziness, congestion, neck pain, cp, sob, cough, abd pain, n/v/d, urinary symptoms, change in bowel habits, melena, hematochezia, lower extremity paresthesias.  Past Medical History:  Diagnosis Date  . Hemiparesis (HCC)    affecting left side, secondary to stroke in 1997  . Stroke [facility])     Patient Active Problem List   Diagnosis Date Noted  . Healthcare maintenance 01/12/2016  . Elevated PSA 07/20/2015  . Right shoulder pain 07/19/2015  . Loss of weight 07/19/2015  . Neck pain on left side 02/22/2015  . Dyslipidemia 11/04/2014  . Tobacco abuse 11/04/2014  . Anxiety 11/04/2014  . History of CVA with residual deficit 11/04/2014  . Hemiparesis affecting left side as late effect of stroke (HCC) 07/07/2014  . Left arm pain 07/07/2014  . Callus 07/07/2014  . Onychomycosis 07/07/2014  . Screening for HIV (human immunodeficiency virus) 07/07/2014    Past Surgical History:  Procedure Laterality Date  . APPENDECTOMY     age 2  . TONSILLECTOMY         Home Medications    Prior to Admission medications   Medication  Sig Start Date End Date Taking? Authorizing Provider  aspirin 325 MG tablet Take 325 mg by mouth daily.    [provider]  atorvastatin (LIPITOR) 40 MG tablet TAKE 1 TABLET EVERY DAY 04/24/15   Karamalegos, Netta Neat, DO  azithromycin (ZITHROMAX) 250 MG tablet Please take two tablets the first day followed by one tablet daily for 5 day course. 03/26/16   Rumley, [place] N, DO  buPROPion (WELLBUTRIN SR) 150 MG 12 hr tablet Take 1 tablet (150 mg) by mouth once daily for 3 days, then take 2 tablets (  total) twice daily. 07/26/16   Marquette Saa, MD  ciprofloxacin (CIPRO) 500 MG tablet Take 1 tablet (500 mg total) by mouth every 12 (twelve) hours. 11/12/16   Rise Mu, PA-C  gabapentin (NEURONTIN) 300 MG capsule Take 1-2 capsules (300-600 mg total) by mouth at bedtime. 11/04/14   Karamalegos, Netta Neat, DO  HYDROcodone-acetaminophen (NORCO) 5-325 MG tablet Take 1 tablet by mouth every 6 (six) hours as needed. 11/12/16   Rise Mu, PA-C  meloxicam (MOBIC) 15 MG tablet TAKE 1 TABLET EVERY DAY AS NEEDED FOR PAIN 09/02/16   Leland Her, DO  naproxen (NAPROSYN) 375 MG tablet Take 1 tablet (375 mg total) by mouth 2 (two) times daily. 11/12/16   Rise Mu, PA-C    Family History Family History  Problem Relation Age of Onset  . Colon cancer Brother 37    Social History Social History  Substance Use Topics  . Smoking status:  Current Every Day Smoker    Packs/day: 0.75    Types: Cigarettes  . Smokeless tobacco: Never Used  . Alcohol use 3.6 oz/week    6 Cans of beer per week     Allergies   Patient has no known allergies.   Review of Systems Review of Systems  Constitutional: Negative for chills and fever.  HENT: Negative for congestion and sore throat.   Eyes: Negative for visual disturbance.  Respiratory: Negative for cough and shortness of breath.   Cardiovascular: Negative for chest pain.  Gastrointestinal: Negative for abdominal  pain, diarrhea, nausea and vomiting.  Genitourinary: Positive for scrotal swelling and testicular pain. Negative for discharge, dysuria, flank pain, frequency, hematuria, penile pain and urgency.  Musculoskeletal: Negative for arthralgias and myalgias.  Skin: Negative for rash.  Neurological: Negative for dizziness, syncope, weakness, light-headedness, numbness and headaches.  Psychiatric/Behavioral: Negative for sleep disturbance. The patient is not nervous/anxious.      Physical Exam Updated Vital Signs BP 122/72 (BP Location: Right Arm)   Pulse 72   Temp 98.2 F (36.8 C) (Oral)   Resp 18   SpO2 98%   Physical Exam  Constitutional: He is oriented to person, place, and time. He appears well-developed and well-nourished.  Non-toxic appearance. No distress.  HENT:  Head: Normocephalic and atraumatic.  Mouth/Throat: Oropharynx is clear and moist.  Eyes: Pupils are equal, round, and reactive to light. Conjunctivae are normal. Right eye exhibits no discharge. Left eye exhibits no discharge.  Neck: Normal range of motion. Neck supple.  Cardiovascular: Normal rate, regular rhythm, normal heart sounds and intact distal pulses.  Exam reveals no gallop and no friction rub.   No murmur heard. Pulmonary/Chest: Effort normal and breath sounds normal. No respiratory distress. He exhibits no tenderness.  Abdominal: Soft. Bowel sounds are normal. He exhibits no distension. There is no tenderness. There is no rebound, no guarding, no CVA tenderness, no tenderness at McBurney's point and negative Murphy's sign.  Genitourinary:  Genitourinary Comments: Chaperone present for exam. uncirumcised male. No penile discharge, erythema, tenderness, lesion, or rash. 2 descended testes. Swelling and pain noted to the right testicle. Left testicle without any swelling or pain. There are no lesions or rash on the scrotum. No inguinal lymphadenopathy or hernia.     Musculoskeletal: Normal range of motion. He  exhibits no tenderness.  Lymphadenopathy:    He has no cervical adenopathy.  Neurological: He is alert and oriented to person, place, and time.  Left arm held in contraction from prior CVA. Patient has limited movement of the left side from prior CVA. Wheelchair-bound.  Skin: Skin is warm and dry. Capillary refill takes less than 2 seconds. No rash noted.  Psychiatric: His behavior is normal. Judgment and thought content normal.  Nursing note and vitals reviewed.    ED Treatments / Results  Labs (all labs ordered are listed, but only abnormal results are displayed) Labs Reviewed  CBC - Abnormal; Notable for the following:       Result Value   Hemoglobin 17.1 (*)    All other components within normal limits  BASIC METABOLIC PANEL - Abnormal; Notable for the following:    Glucose, Bld 63 (*)    BUN <5 (*)    All other components within normal limits  URINALYSIS, ROUTINE W REFLEX MICROSCOPIC - Abnormal; Notable for the following:    Color, Urine STRAW (*)    Specific Gravity, Urine 1.004 (*)    All other components within normal limits  EKG  EKG Interpretation None       Radiology Ct Abdomen Pelvis W Contrast  Result Date: 11/12/2016 CLINICAL DATA:  Lower abdominal pain. Right-sided groin and testicular pain x3 days. EXAM: CT ABDOMEN AND PELVIS WITH CONTRAST TECHNIQUE: Multidetector CT imaging of the abdomen and pelvis was performed using the standard protocol following bolus administration of intravenous contrast. CONTRAST:  ISOVUE-300 IOPAMIDOL (ISOVUE-300) INJECTION 61% COMPARISON:  Same day scrotal ultrasound. FINDINGS: Lower chest: Minimal atelectasis at each lung base. Normal heart size without pericardial effusion or thickening. Hepatobiliary: No focal liver abnormality is seen. No gallstones, gallbladder wall thickening, or biliary dilatation. Pancreas: Unremarkable. No pancreatic ductal dilatation or surrounding inflammatory changes. Spleen: Normal in size  without focal abnormality. Adrenals/Urinary Tract: Adrenal glands are unremarkable. Kidneys are normal, without renal calculi, focal lesion, or hydronephrosis. Bladder is unremarkable. Stomach/Bowel: Nondistended stomach. Normal small bowel rotation without bowel obstruction or inflammation. Status post appendectomy. Scattered left-sided colonic diverticulosis without acute diverticulitis. No mucosal enhancement of the descending colon through rectum. Findings are nonspecific but could potentially represent a mild colitis. Vascular/Lymphatic: Aortic atherosclerosis. No enlarged abdominal or pelvic lymph nodes. Reproductive: Prostate is unremarkable. Other: No abdominal wall hernia or abnormality. Fat containing left inguinal hernia. The right inguinal canal is unremarkable. No bowel herniation is seen. No abdominopelvic ascites. Musculoskeletal: No acute or significant osseous findings. IMPRESSION: 1. Herniation of properitoneal fat into the left inguinal canal. The right inguinal canal is unremarkable. No herniation of bowel is seen. No findings to explain the echogenic focus within the right inguinal canal lumen earlier scrotal ultrasound. 2. Mild mucosal enhancement of the left colon to the level of the rectum. The possibility of mild colitis not entirely excluded. Scattered colonic diverticulosis without acute diverticulitis is otherwise noted. Electronically Signed   By: Tollie Eth M.D.   On: 11/12/2016 20:18   [place] Scrotom W/doppler  Result Date: 11/12/2016 CLINICAL DATA:  Pain and swelling of the right testicle EXAM: SCROTAL ULTRASOUND DOPPLER ULTRASOUND OF THE TESTICLES TECHNIQUE: Complete ultrasound examination of the testicles, epididymis, and other scrotal structures was performed. Color and spectral Doppler ultrasound were also utilized to evaluate blood flow to the testicles. COMPARISON:  None. FINDINGS: Right testicle Measurements: 3.1 x 2.1 x 2.8 cm. No mass or microlithiasis visualized. Left  testicle Measurements: 3.3 x 1.7 x 2.7 cm. Small intratesticular cyst containing scattered echoes measuring 5 mm. Right epididymis:  Normal in size and appearance. Left epididymis:  Normal in size and appearance. Hydrocele:  Small right hydrocele containing particulate matter. Varicocele:  None visualized. Soft tissue echogenicity extending into the right scrotum. Pulsed Doppler interrogation of both testes demonstrates normal low resistance arterial and venous waveforms bilaterally. IMPRESSION: 1. Negative for testicular torsion 2. Small right hydrocele containing particulate debris. Soft tissue echogenicity present within the right scrotal sac is suspicious for hernia, possibly containing nondistended bowel. CT could be obtained for confirmation and to evaluate for the presence or absence of obstruction. 3. 5 mm slightly complicated left intratesticular cyst Electronically Signed   By: Jasmine Pang M.D.   On: 11/12/2016 18:03    Procedures Procedures (including critical care time)  Medications Ordered in ED Medications  iopamidol (ISOVUE-300) 61 % injection (100 mLs  Contrast Given 11/12/16 1955)     Initial Impression / Assessment and Plan / ED Course  I have reviewed the triage vital signs and the nursing notes.  Pertinent labs & imaging results that were available during my care of the patient were reviewed by  me and considered in my medical decision making (see chart for details).     Patient presents to the ED with complaints of righttesticular pain and swelling. Ongoing for 3 days after straining to get out of his wheelchair. Afraid he may have a hernia. Denies any associated urinary symptoms, change in bowel habits, nausea, vomiting, fevers, penile discharge. Patient is not sexually active.  Patient is overall well-appearing and nontoxic. Vital signs reassuring. Patient is afebrile.  Abdominal exam is benign. Patient does have some right testicular pain and minimal swelling noted of  the right testicle. No pain or swelling noted left testicle. No penile discharge.  Lab work was obtained in triage. No leukocytosis noted. Urine shows no signs of infection. Kidney function is normal.  Ultrasound was obtained in triage of scrotum;  1. Negative for testicular torsion 2. Small right hydrocele containing particulate debris. Soft tissue echogenicity present within the right scrotal sac is suspicious for hernia, possibly containing nondistended bowel. CT could be obtained for confirmation and to evaluate for the presence or absence of obstruction. 3. 5 mm slightly complicated left intratesticular cyst  These findings were discussed with patient. Did obtain CT abdomen rule out any obstruction however I have low suspicion.  Ct abd/pelvis;  1. Herniation of properitoneal fat into the left inguinal canal. The right inguinal canal is unremarkable. No herniation of bowel is seen. No findings to explain the echogenic focus within the right inguinal canal lumen earlier scrotal ultrasound. 2. Mild mucosal enhancement of the left colon to the level of the rectum. The possibility of mild colitis not entirely excluded. Scattered colonic diverticulosis without acute diverticulitis is otherwise noted.  CT showed no appreciable findings of the right testicle. Ultrasound does reveal a possible hydrocele. This may be causing his pain however may be due to epididymitis from trauma. Patient denies any sexual activities low suspicion for STD. However after discussing with the attending will start patient on antibiotics to cover epididymitis. Provider scrotal support and instructed to apply ice for swelling.UA shows no signs of infection. Low suspicion for prostatitis.  Did discuss the left testicular cyst that was noted. CT reports possible colitis. Patient denies any bowel symptoms. He is afebrile. No leukocytosis. Low suspicion for colitis. We will need to follow-up with primary care as needed.  I also  given him a referral to urology.  Pt is hemodynamically stable, in NAD, & able to ambulate in the ED. Evaluation does not show pathology that would require ongoing emergent intervention or inpatient treatment. I explained the diagnosis to the patient. Pain has been managed & has no complaints prior to dc. Pt is comfortable with above plan and is stable for discharge at this time. All questions were answered prior to disposition. Strict return precautions for f/u to the ED were discussed. Encouraged follow up with PCP.  Dicussed and seen by Dr. Lynelle Doctor who is agreeable with the above plan.     Final Clinical Impressions(s) / ED Diagnoses   Final diagnoses:  Pain  Testicular pain  Testicular swelling    New Prescriptions Discharge Medication List as of 11/12/2016  8:50 PM    START taking these medications   Details  HYDROcodone-acetaminophen (NORCO) 5-325 MG tablet Take 1 tablet by mouth every 6 (six) hours as needed., Starting Tue 11/12/2016, Print    naproxen (NAPROSYN) 375 MG tablet Take 1 tablet (375 mg total) by mouth 2 (two) times daily., Starting Tue 11/12/2016, Print         [place],  Lynann Beaver, PA-C 11/12/16 2155    Linwood Dibbles, MD 11/12/16 [phone]

## 2016-11-12 NOTE — ED Provider Notes (Signed)
Imaging results discussed with patient and family.  Pt has been having right testicle pain.  Will treat for epididymitis.  Outpatient follow up with urology.  Medical screening examination/treatment/procedure(s) were conducted as a shared visit with non-physician practitioner(s) and myself.  I personally evaluated the patient during the encounter.     Linwood Dibbles, MD 11/12/16 2039

## 2016-11-29 ENCOUNTER — Emergency Department (HOSPITAL_COMMUNITY): Payer: Medicare HMO

## 2016-11-29 ENCOUNTER — Emergency Department (HOSPITAL_COMMUNITY)
Admission: EM | Admit: 2016-11-29 | Discharge: 2016-11-30 | Disposition: A | Discharge status: Home / Self Care | Payer: Medicare HMO | Attending: Emergency Medicine | Admitting: Emergency Medicine

## 2016-11-29 ENCOUNTER — Encounter (HOSPITAL_COMMUNITY): Payer: Self-pay

## 2016-11-29 DIAGNOSIS — N453 Epididymo-orchitis: Secondary | ICD-10-CM | POA: Diagnosis not present

## 2016-11-29 DIAGNOSIS — F17210 Nicotine dependence, cigarettes, uncomplicated: Secondary | ICD-10-CM | POA: Insufficient documentation

## 2016-11-29 DIAGNOSIS — Z7982 Long term (current) use of aspirin: Secondary | ICD-10-CM | POA: Diagnosis not present

## 2016-11-29 DIAGNOSIS — I69354 Hemiplegia and hemiparesis following cerebral infarction affecting left non-dominant side: Secondary | ICD-10-CM | POA: Insufficient documentation

## 2016-11-29 DIAGNOSIS — Z79899 Other long term (current) drug therapy: Secondary | ICD-10-CM | POA: Diagnosis not present

## 2016-11-29 DIAGNOSIS — N451 Epididymitis: Secondary | ICD-10-CM | POA: Diagnosis not present

## 2016-11-29 DIAGNOSIS — N50811 Right testicular pain: Secondary | ICD-10-CM | POA: Diagnosis present

## 2016-11-29 LAB — URINALYSIS, ROUTINE W REFLEX MICROSCOPIC
BILIRUBIN URINE: NEGATIVE
Bacteria, UA: NONE SEEN
Glucose, UA: NEGATIVE mg/dL
HGB URINE DIPSTICK: NEGATIVE
Ketones, ur: 5 mg/dL — AB
LEUKOCYTES UA: NEGATIVE
NITRITE: NEGATIVE
PH: 5 (ref 5.0–8.0)
Protein, ur: 30 mg/dL — AB
SPECIFIC GRAVITY, URINE: 1.031 — AB (ref 1.005–1.030)

## 2016-11-29 LAB — BASIC METABOLIC PANEL
Anion gap: 7 (ref 5–15)
BUN: 15 mg/dL (ref 6–20)
CHLORIDE: 107 mmol/L (ref 101–111)
CO2: 24 mmol/L (ref 22–32)
Calcium: 8.7 mg/dL — ABNORMAL LOW (ref 8.9–10.3)
Creatinine, Ser: 1 mg/dL (ref 0.61–1.24)
GFR calc Af Amer: >60 mL/min (ref >60)
GFR calc non Af Amer: >60 mL/min (ref >60)
Glucose, Bld: 102 mg/dL — ABNORMAL HIGH (ref 65–99)
POTASSIUM: 3.7 mmol/L (ref 3.5–5.1)
SODIUM: 138 mmol/L (ref 135–145)

## 2016-11-29 LAB — CBC WITH DIFFERENTIAL/PLATELET
Basophils Absolute: 0 10*3/uL (ref 0.0–0.1)
Basophils Relative: 1 %
EOS ABS: 0.1 10*3/uL (ref 0.0–0.7)
EOS PCT: 2 %
HCT: 49.1 % (ref 39.0–52.0)
HEMOGLOBIN: 16.7 g/dL (ref 13.0–17.0)
LYMPHS ABS: 1.8 10*3/uL (ref 0.7–4.0)
LYMPHS PCT: 42 %
MCH: 30.8 pg (ref 26.0–34.0)
MCHC: 34 g/dL (ref 30.0–36.0)
MCV: 90.4 fL (ref 78.0–100.0)
MONOS PCT: 9 %
Monocytes Absolute: 0.4 10*3/uL (ref 0.1–1.0)
NEUTROS PCT: 46 %
Neutro Abs: 2 10*3/uL (ref 1.7–7.7)
Platelets: 149 10*3/uL — ABNORMAL LOW (ref 150–400)
RBC: 5.43 MIL/uL (ref 4.22–5.81)
RDW: 14.3 % (ref 11.5–15.5)
WBC: 4.3 10*3/uL (ref 4.0–10.5)

## 2016-11-29 MED ORDER — OXYCODONE-ACETAMINOPHEN 5-325 MG PO TABS
1.0000 | ORAL_TABLET | ORAL | Status: DC | PRN
  Administered 2016-11-29: 1 via ORAL

## 2016-11-29 MED ORDER — OXYCODONE-ACETAMINOPHEN 5-325 MG PO TABS
ORAL_TABLET | ORAL | Status: AC
  Filled 2016-11-29: qty 1

## 2016-11-29 NOTE — ED Triage Notes (Signed)
Pt endorses right testicle pain with lower abd pain. Denies n/v/d or urinary problems. Pt seen here recently for the same and given antibiotics without relief. VSS

## 2016-11-30 ENCOUNTER — Emergency Department (HOSPITAL_COMMUNITY): Payer: Medicare HMO

## 2016-11-30 DIAGNOSIS — N451 Epididymitis: Secondary | ICD-10-CM | POA: Diagnosis not present

## 2016-11-30 MED ORDER — LIDOCAINE HCL (PF) 1 % IJ SOLN
INTRAMUSCULAR | Status: AC
  Filled 2016-11-30: qty 5

## 2016-11-30 MED ORDER — CEFDINIR 300 MG PO CAPS: 300.0000 mg | ORAL_CAPSULE | Freq: Two times a day (BID) | ORAL | 0 refills | Status: AC

## 2016-11-30 MED ORDER — CEFTRIAXONE SODIUM 250 MG IJ SOLR
250.0000 mg | Freq: Once | INTRAMUSCULAR | Status: AC
  Administered 2016-11-30: 250 mg via INTRAMUSCULAR
  Filled 2016-11-30: qty 250

## 2016-11-30 MED ORDER — HYDROCODONE-ACETAMINOPHEN 5-325 MG PO TABS: 1.0000 | ORAL_TABLET | ORAL | 0 refills | Status: DC | PRN

## 2016-11-30 NOTE — ED Provider Notes (Signed)
[facility] Provider Note   CSN: [id number] Arrival date & time: 11/29/16  1444     History   Chief Complaint Chief Complaint  Patient presents with  . Testicle Pain  . Abdominal Pain    HPI Vincent Byrd is a 56 y.o. male with past medical history of stroke with residual left hemiparesis, presenting to the ED for subsequent visit for right-sided testicular pain.  Patient was seen in this ED on 11/12/2016 for similar symptoms, with testicular ultrasound Doppler showing right hydrocele.  CT abdomen pelvis negative for acute pathology in the right testicle.  Patient was treated for epididymitis with ciprofloxacin and discharged with pain medication.  He states that he been taking the antibiotics 2 times per day as prescribed, even took 2 doses this morning because he felt they were not working.  He states the pain is aching and swelling in his right testicle and radiating up into his right groin.  Has been taking the prescribed hydrocodone with some relief of symptoms.  He denies urinary frequency or dysuria, abdominal pain, nausea, vomiting, diarrhea, constipation, fever or chills, pain with defecation, or any other complaints.  Patient states he is not sexually active.  The history is provided by the patient.    Past Medical History:  Diagnosis Date  . Hemiparesis (HCC)    affecting left side, secondary to stroke in 1997  . Stroke [facility](HCC)     Patient Active Problem List   Diagnosis Date Noted  . Healthcare maintenance 01/12/2016  . Elevated PSA 07/20/2015  . Right shoulder pain 07/19/2015  . Loss of weight 07/19/2015  . Neck pain on left side 02/22/2015  . Dyslipidemia 11/04/2014  . Tobacco abuse 11/04/2014  . Anxiety 11/04/2014  . History of CVA with residual deficit 11/04/2014  . Hemiparesis affecting left side as late effect of stroke (HCC) 07/07/2014  . Left arm pain 07/07/2014  . Callus 07/07/2014  . Onychomycosis 07/07/2014  .  Screening for HIV (human immunodeficiency virus) 07/07/2014    Past Surgical History:  Procedure Laterality Date  . APPENDECTOMY     age 56  . TONSILLECTOMY         Home Medications    Prior to Admission medications   Medication Sig Start Date End Date Taking? Authorizing Provider  aspirin 325 MG tablet Take 325 mg by mouth every other day.    Yes [provider]  ciprofloxacin (CIPRO) 500 MG tablet Take 1 tablet (500 mg total) by mouth every 12 (twelve) hours. Patient taking differently: Take 500 mg by mouth every 12 (twelve) hours. 7 day course filled 11/15/16 11/12/16  Yes Leaphart, Lynann BeaverKenneth T, PA-C  HYDROcodone-acetaminophen (NORCO) 5-325 MG tablet Take 1 tablet by mouth every 6 (six) hours as needed. Patient taking differently: Take 1 tablet by mouth every 6 (six) hours as needed (pain).  11/12/16  Yes Leaphart, Lynann BeaverKenneth T, PA-C  meloxicam (MOBIC) 15 MG tablet TAKE 1 TABLET EVERY DAY AS NEEDED FOR PAIN Patient taking differently: Take 15-30 mg by mouth daily as needed for pain.  09/02/16  Yes Jeneen RinksYoo, Elsia J, DO  naproxen (NAPROSYN) 375 MG tablet Take 1 tablet (375 mg total) by mouth 2 (two) times daily. 11/12/16  Yes Demetrios LollLeaphart, Kenneth T, PA-C  atorvastatin (LIPITOR) 40 MG tablet TAKE 1 TABLET EVERY DAY Patient not taking: Reported on 11/29/2016 04/24/15   Smitty CordsKaramalegos, Alexander J, DO  buPROPion [facility](WELLBUTRIN SR) 150 MG 12 hr tablet Take 1 tablet (150 mg) by  mouth once daily for 3 days, then take 2 tablets (300mg  total) twice daily. Patient not taking: Reported on 11/29/2016 07/26/16   Marquette Saa, MD  cefdinir (OMNICEF) 300 MG capsule Take 1 capsule (300 mg total) by mouth 2 (two) times daily. 11/30/16 12/10/16  Robinson, [place] N, PA-C  gabapentin (NEURONTIN) 300 MG capsule Take 1-2 capsules (300-600 mg total) by mouth at bedtime. Patient not taking: Reported on 11/29/2016 11/04/14   Smitty Cords, DO  HYDROcodone-acetaminophen (NORCO/VICODIN) 5-325 MG tablet  Take 1-2 tablets by mouth every 4 (four) hours as needed for severe pain. 11/30/16   Robinson, [place] N, PA-C    Family History Family History  Problem Relation Age of Onset  . Colon cancer Brother 67    Social History Social History  Substance Use Topics  . Smoking status: Current Every Day Smoker    Packs/day: 0.75    Types: Cigarettes  . Smokeless tobacco: Never Used  . Alcohol use 3.6 oz/week    6 Cans of beer per week     Comment: occ     Allergies   Amoxicillin and Ampicillin   Review of Systems Review of Systems  Constitutional: Negative for chills and fever.  Gastrointestinal: Negative for abdominal pain, constipation, diarrhea, nausea, rectal pain and vomiting.  Genitourinary: Positive for testicular pain. Negative for discharge, dysuria, frequency, penile pain and scrotal swelling.  Musculoskeletal: Negative for back pain.  Allergic/Immunologic: Negative for immunocompromised state.  All other systems reviewed and are negative.    Physical Exam Updated Vital Signs BP 124/72 (BP Location: Right Arm)   Pulse 86   Temp 98 F (36.7 C) (Oral)   Resp 16   Ht 5' 8.75" (1.746 m)   Wt 74.8 kg (165 lb)   SpO2 99%   BMI 24.54 kg/m   Physical Exam  Constitutional: He appears well-developed and well-nourished. No distress.  HENT:  Head: Normocephalic and atraumatic.  Eyes: Conjunctivae are normal.  Cardiovascular: Normal rate, regular rhythm, normal heart sounds and intact distal pulses.   Pulmonary/Chest: Effort normal and breath sounds normal.  Abdominal: Soft. Bowel sounds are normal. He exhibits no distension and no mass. There is no tenderness. There is no rebound and no guarding.  Genitourinary: Penis normal. Right testis shows mass, swelling and tenderness. Left testis shows no mass, no swelling and no tenderness. Circumcised. No penile erythema or penile tenderness. No discharge found.  Genitourinary Comments: Exam performed with chaperone present.   Testicle with tenderness and firm mass on superior aspect of testicle.  Right testicle is larger than left. Some tenderness along inguinal canal, however no palpable lymphadenopathy or hernia on right. Scrotum is not edematous or erythematous. No rashes or lesions.  Neurological: He is alert.  Skin: Skin is warm.  Psychiatric: He has a normal mood and affect. His behavior is normal.  Nursing note and vitals reviewed.   ED Treatments / Results  Labs (all labs ordered are listed, but only abnormal results are displayed) Labs Reviewed  URINALYSIS, ROUTINE W REFLEX MICROSCOPIC - Abnormal; Notable for the following:       Result Value   Specific Gravity, Urine 1.031 (*)    Ketones, ur 5 (*)    Protein, ur 30 (*)    Squamous Epithelial / LPF 0-5 (*)    All other components within normal limits  CBC WITH DIFFERENTIAL/PLATELET - Abnormal; Notable for the following:    Platelets 149 (*)    All other components within normal limits  BASIC METABOLIC PANEL - Abnormal; Notable for the following:    Glucose, Bld 102 (*)    Calcium 8.7 (*)    All other components within normal limits    EKG  EKG Interpretation None       Radiology US Scrotum  Result Date: 11/30/2016 CLINICAL DATA:  56 year old with right scrotal pain with mass. EXAM: SCROTAL ULTRASOUND DOPPLER ULTRASOUND OF THE TESTICLES TECHNIQUE: Complete ultrasound examination of the testicles, epididymis, and other scrotal structures was performed. Color and spectral Doppler ultrasound were also utilized to evaluate blood flow to the testicles. COMPARISON:  Scrotal ultrasound and CT 11/12/2016 FINDINGS: Right testicle Measurements: 3.5 x 2.5 x 2.5 cm. No testicular mass or microlithiasis visualized. Echotexture is homogeneous. Blood flow is noted, possible hyperemia. Left testicle Measurements: 3.2 x 2.0 x 2.5 cm. 5 mm testicular cyst again seen. No suspicious testicular mass. Blood flow is noted. Right epididymis: Increased size from  prior exam and heterogeneous. Increased vascularity. Left epididymis:  Normal in size and appearance. Hydrocele:  Small on the right, complex. Varicocele:  None visualized. Pulsed Doppler interrogation of both testes demonstrates normal low resistance arterial and venous waveforms bilaterally. The echogenic structure in the right scrotal sac on prior exam is not currently visualized. IMPRESSION: 1. Right epididymitis and possible orchitis. 2. Small complex right hydrocele which is likely reactive. 3. Unchanged 5 mm left testicular cyst. Electronically Signed   By: Rubye Oaks M.D.   On: 11/30/2016 01:23   [place] Art/ven Flow Abd Pelv Doppler  Result Date: 11/30/2016 CLINICAL DATA:  55 year old with right scrotal pain with mass. EXAM: SCROTAL ULTRASOUND DOPPLER ULTRASOUND OF THE TESTICLES TECHNIQUE: Complete ultrasound examination of the testicles, epididymis, and other scrotal structures was performed. Color and spectral Doppler ultrasound were also utilized to evaluate blood flow to the testicles. COMPARISON:  Scrotal ultrasound and CT 11/12/2016 FINDINGS: Right testicle Measurements: 3.5 x 2.5 x 2.5 cm. No testicular mass or microlithiasis visualized. Echotexture is homogeneous. Blood flow is noted, possible hyperemia. Left testicle Measurements: 3.2 x 2.0 x 2.5 cm. 5 mm testicular cyst again seen. No suspicious testicular mass. Blood flow is noted. Right epididymis: Increased size from prior exam and heterogeneous. Increased vascularity. Left epididymis:  Normal in size and appearance. Hydrocele:  Small on the right, complex. Varicocele:  None visualized. Pulsed Doppler interrogation of both testes demonstrates normal low resistance arterial and venous waveforms bilaterally. The echogenic structure in the right scrotal sac on prior exam is not currently visualized. IMPRESSION: 1. Right epididymitis and possible orchitis. 2. Small complex right hydrocele which is likely reactive. 3. Unchanged 5 mm left  testicular cyst. Electronically Signed   By: Rubye Oaks M.D.   On: 11/30/2016 01:23    Procedures Procedures (including critical care time)  Medications Ordered in ED Medications  oxyCODONE-acetaminophen (PERCOCET/ROXICET) 5-325 MG per tablet 1 tablet (1 tablet Oral Given 11/29/16 1551)  cefTRIAXone (ROCEPHIN) injection 250 mg (not administered)     Initial Impression / Assessment and Plan / ED Course  I have reviewed the triage vital signs and the nursing notes.  Pertinent labs & imaging results that were available during my care of the patient were reviewed by me and considered in my medical decision making (see chart for details).     Patient presenting for subsequent visit with right testicular pain.  On exam, testicle edematous with firm mass on superior portion with tenderness. No lymphadenopathy palpated. Pt afebrile, nontoxic. U/A neg for infection. Labs unremarkable. Repeat U/S ordered to evaluate  palpable mass.  Ultrasound consistent with right-sided epididymitis and orchitis. Will treat with IM Rocephin in ED, and discharge with Cefdinir. Neurology referral given as needed if symptoms persist.  Return precautions discussed.  Patient is well-appearing and safe for discharge at this time.  Patient discussed with and seen by Dr. Anitra Lauth.  Discussed results, findings, treatment and follow up. Patient advised of return precautions. Patient verbalized understanding and agreed with plan.  Final Clinical Impressions(s) / ED Diagnoses   Final diagnoses:  Orchitis and epididymitis    New Prescriptions New Prescriptions   CEFDINIR (OMNICEF) 300 MG CAPSULE    Take 1 capsule (300 mg total) by mouth 2 (two) times daily.   HYDROCODONE-ACETAMINOPHEN (NORCO/VICODIN) 5-325 MG TABLET    Take 1-2 tablets by mouth every 4 (four) hours as needed for severe pain.     Robinson, [place] N, PA-C 11/30/16 0140    Gwyneth Sprout, MD 12/02/16 1700

## 2016-11-30 NOTE — ED Notes (Signed)
Patient transported to Ultrasound 

## 2016-11-30 NOTE — Discharge Instructions (Signed)
Please read instructions below. Stop taking the ciprofloxacin. Begin taking the antibiotic, Cefdinir, 2 times per day until it is gone. You can take the hydrocodone every 4-6 hours as needed for severe pain.  For mild pain you can take Tylenol, however be sure to take one or the other - do not take these together. Schedule appointment with the urologist if symptoms persist. Return to the ER for fever, or new or concerning symptoms.

## 2017-01-22 ENCOUNTER — Telehealth: Payer: Self-pay | Admitting: Internal Medicine

## 2017-01-22 NOTE — Telephone Encounter (Signed)
Pt is calling and needs a letter or prescription written so that he can get his wheelchair fixed. jw

## 2017-01-23 NOTE — Telephone Encounter (Signed)
Power wheelchair Rx to be sent to [facility] (fax number: [phone]) instead of [facility] because [facility]

## 2017-01-29 ENCOUNTER — Other Ambulatory Visit: Payer: Self-pay | Admitting: Internal Medicine

## 2017-01-29 DIAGNOSIS — I69354 Hemiplegia and hemiparesis following cerebral infarction affecting left non-dominant side: Secondary | ICD-10-CM

## 2017-01-29 NOTE — Telephone Encounter (Signed)
Pt is calling again about wanting this Rx for a power wheelchair. He said nothing has been done about this yet. Please advise

## 2017-01-29 NOTE — Telephone Encounter (Signed)
Message sent to Henderson Newcom[facility]issa [facility] to see if patient will need to have another Eval done or if PCP can place order for new power w/c in Epic. Awaiting response. Kinnie FeilL. Ducatte, RN, BSN

## 2017-01-30 NOTE — Telephone Encounter (Signed)
Patient spoke with Alecia LemmingLauren Ducatte at 3:30PM (30 minutes ago) and she provided detailed update on status of his power chair. She also provided patient with direct number for Rehab Department so he can call them directly for updates. See her note from today's date.   Tarri AbernethyAbigail J Lancaster, MD, MPH PGY-3 [facility] Pager [phone]

## 2017-01-30 NOTE — Telephone Encounter (Signed)
PCP has placed order in Epic for power wheel chair. [facility] will[facility] pull all recent office visit notes and forward to their rehab dept. Vincent Byrd or her team will let us know if anything else will be needed after they review all documentation. Patient made aware and given direct line to Rehab Dept [phone] Opt 1. Vincent FeilL. Ducatte, RN, BSN

## 2017-01-30 NOTE — Telephone Encounter (Signed)
Pt calling asking whether or not this request has been sent in or not. Please contact pt if it has.

## 2017-01-30 NOTE — Telephone Encounter (Signed)
Would like an update on the wheelchair. jw

## 2017-02-03 NOTE — Telephone Encounter (Signed)
Pt informed. Deseree Blount, CMA  

## 2017-02-03 NOTE — Telephone Encounter (Signed)
Following message received today from Henderson NewcomerMelissa Stenson at [facility] from custom mobility team:  "I do have this referral and I have spoken with the patient today. We may run into a problem with this patient as he believes his chair is under 58 years old and did not tell me who he got his chair from/how it is unrepairable. I will have to verify with his insurance if he is eligible for a new chair, but I wanted to inform you as well. Also, patient is asking to receive a loaner chair; apparently he is [facility] loaning [facility] but they are coming to pick it up today/tomorrow. I informed him that our seating specialist would be able to determine loaner chair need at the evaluation once scheduled. I wanted to reach out to you in response to this email, as there may be future hold ups."

## 2017-02-03 NOTE — Telephone Encounter (Signed)
Pt says [facility] faxed OT and PT orders to be signed by Dr Natale MilchLancaster. [facility] hasnt received the signed orders back.  These need to be signed to get his wheelchair.  Please advise

## 2017-02-03 NOTE — Telephone Encounter (Signed)
Forms received within past couple hours. Forms completed and placed in box to be faxed. In the future, please inform patient that practice policy is to allow 48 hours for paperwork to be completed.   Vincent AbernethyAbigail J Lancaster, MD, MPH PGY-3 [facility] Pager [phone]

## 2017-02-03 NOTE — Telephone Encounter (Signed)
Noted. Appreciate excellent assistance of Lauren Ducatte RN throughout this process.   Tarri AbernethyAbigail J Maguire Killmer, MD, MPH PGY-3 [facility] Pager [phone]

## 2017-02-11 DIAGNOSIS — I69354 Hemiplegia and hemiparesis following cerebral infarction affecting left non-dominant side: Secondary | ICD-10-CM | POA: Diagnosis not present

## 2017-02-12 ENCOUNTER — Other Ambulatory Visit: Payer: Self-pay | Admitting: Internal Medicine

## 2017-02-19 ENCOUNTER — Telehealth: Payer: Self-pay | Admitting: Internal Medicine

## 2017-02-19 NOTE — Telephone Encounter (Signed)
Called patient to inform him that handicapped parking documentation was ready to be picked up at front desk. Patient stating that his power wheelchair is broken so he does not have a way to come to the office to pick this up. Asking instead that this be mailed to him. Will mail to patient.   Tarri AbernethyAbigail J Ellana Kawa, MD, MPH PGY-3 [facility] Pager [phone]

## 2017-03-05 ENCOUNTER — Telehealth: Payer: Self-pay | Admitting: Internal Medicine

## 2017-03-05 NOTE — Telephone Encounter (Signed)
Pt called about the letter Natale MilchLancaster gave him to get a mailbox put on his house instead of having to go out to his mailbox. Pt said he mailed the letter to the post office and he still has not heard anything back from them. He would like Natale MilchLancaster to type up a detailed letter for why he needs this service and include his name and home address, which is correct in his chart and fax it to the post office. The main post office fax number is [phone]. Please advise

## 2017-03-12 NOTE — Telephone Encounter (Signed)
Letter printed and will be faxed to post office at number provided by patient.   Tarri AbernethyAbigail J Makael Stein, MD, MPH PGY-3 [facility] Pager [phone]

## 2017-03-13 ENCOUNTER — Other Ambulatory Visit: Payer: Self-pay

## 2017-03-13 ENCOUNTER — Ambulatory Visit (INDEPENDENT_AMBULATORY_CARE_PROVIDER_SITE_OTHER): Payer: Medicare HMO | Admitting: Internal Medicine

## 2017-03-13 ENCOUNTER — Encounter: Payer: Self-pay | Admitting: Internal Medicine

## 2017-03-13 DIAGNOSIS — I69354 Hemiplegia and hemiparesis following cerebral infarction affecting left non-dominant side: Secondary | ICD-10-CM

## 2017-03-13 MED ORDER — BACLOFEN 10 MG PO TABS
5.0000 mg | ORAL_TABLET | Freq: Three times a day (TID) | ORAL | 2 refills | Status: DC | PRN
Start: 2017-03-13 — End: 2017-05-28

## 2017-03-13 NOTE — Patient Instructions (Addendum)
It was nice seeing you again today [name]!  I have sent the [facility] you. You can take one-half tablet up to 3 times per day as needed.   I will send the forms for your power wheelchair within the next couple days. If they need any additional information please let me know.   Please schedule a full physical appointment at your earliest convenience.   If you have any questions or concerns, please feel free to call the clinic.   Be well,  Dr. Natale MilchLancaster

## 2017-03-13 NOTE — Assessment & Plan Note (Signed)
Significant weakness of L upper and lower extremity as result of CVA. As such, it is my professional recommendation that patient requires a power wheelchair to complete ADLs, as well as to ambulate to necessary medical appointments. Patient also requiring tilt as he cannot relieve pressure and is at risk of developing pressure ulcers without a tilt.  Face to Face form, office notes from today's date, Detailed Production Description, 7 Element, and signed and dated PT eval faxed to [facility] at [phone] today.  Will be happy to provide any additional information or assistance as needed.

## 2017-03-13 NOTE — Progress Notes (Addendum)
Subjective:   Patient: Vincent Byrd       Birthdate: 07/17/1960       MRN: [id number]      HPI  Vincent GladHerbert F Tippens is a 57 y.o. male presenting for mobility evaluation.   Mobility evaluation Patient presents for Face to Face appointment today to determine power wheelchair needs. He is currently using a loaned power wheelchair. Patient has L-sided hemiparesis after a CVA, and has thus required power wheelchair since the incident. Patient requires a power wheelchair at home, as he is unable to ambulate without it, and as such is unable to cook, clean, and ambulate to the bathroom. Patient has already been evaluated by PT, who agrees that he requires a power wheelchair. I have read and agree with the PT evaluation. In agreement with PT, I feel that patient requires a til due to his inability to pressure relieve. Patient is unable to use a cane or a walker due to L upper and lower extremity weakness. He has no grip strength in his L upper arm and only 1/5 strength in the affected arm, so is unable to grasp a walker/cane or use the arm to hold the device. He also has severe weakness in the L lower extremity with 0/5 strength, so is unable to stand despite using a cane/walker. He also has significant balance issues, which make standing even more difficulty. He is similarly unable to operate a manual wheelchair as he has minimal strength in the L upper extremity (1/5 strength as aforementioned). While he does have strength in the R upper extremity, his lack of strength in the L upper extremity allows him only to maneuver a manual wheelchair in circles. He is unable to use a scoot as he has significantly diminished grip strength in the L hand and is thus unable to steer. The weakness of his L leg also makes transferring on and off scooter extremely difficulty and dangerous. Patient has mental capacity to operate a power wheelchair safely both around his house and in public, as is demonstrated by his describing  to me how to use a wheelchair, and also as is demonstrated by his previous use of a power wheelchair for many years. He is also very willing to use his power wheelchair at home, as he cannot ambulate in his house otherwise.   Smoking status reviewed. Patient is current every day smoker.   Review of Systems See HPI.     Objective:  Physical Exam  Constitutional: He is oriented to person, place, and time.  Pleasant gentleman sitting in power wheelchair  HENT:  Head: Normocephalic and atraumatic.  Pulmonary/Chest: Effort normal. No respiratory distress.  Musculoskeletal:  Significant L-sided weakness, with 0/5 strength in L lower extremity and 1/5 strength in L upper extremity. Significantly diminished grip strength in L hand (patient is able to place fingers around examiner's fingers but is unable to squeeze). 5/5 strength R upper and lower extremities.   Neurological: He is alert and oriented to person, place, and time. He exhibits abnormal muscle tone (Increased tone in LLE).  Skin: Skin is warm and dry.  Psychiatric: Affect and judgment normal.     Assessment & Plan:  Hemiparesis affecting left side as late effect of stroke (HCC) Significant weakness of L upper and lower extremity as result of CVA. As such, it is my professional recommendation that patient requires a power wheelchair to complete ADLs, as well as to ambulate to necessary medical appointments. Patient also requiring tilt as he  cannot relieve pressure and is at risk of developing pressure ulcers without a tilt.  Face to Face form, office notes from today's date, Detailed Production Description, 7 Element, and signed and dated PT eval faxed to [facility] at [phone] today.  Will be happy to provide any additional information or assistance as needed.    Tarri Abernethy, MD, MPH PGY-3 [facility] Pager [phone]

## 2017-04-04 ENCOUNTER — Ambulatory Visit: Payer: Medicare HMO | Admitting: Internal Medicine

## 2017-04-11 ENCOUNTER — Ambulatory Visit (INDEPENDENT_AMBULATORY_CARE_PROVIDER_SITE_OTHER): Payer: Medicare HMO | Admitting: Internal Medicine

## 2017-04-11 ENCOUNTER — Encounter: Payer: Self-pay | Admitting: Internal Medicine

## 2017-04-11 VITALS — BP 124/79 | HR 92 | Temp 98.9°F

## 2017-04-11 DIAGNOSIS — R5383 Other fatigue: Secondary | ICD-10-CM | POA: Diagnosis not present

## 2017-04-11 DIAGNOSIS — Z789 Other specified health status: Secondary | ICD-10-CM | POA: Diagnosis not present

## 2017-04-11 DIAGNOSIS — E559 Vitamin D deficiency, unspecified: Secondary | ICD-10-CM | POA: Diagnosis not present

## 2017-04-11 DIAGNOSIS — I69354 Hemiplegia and hemiparesis following cerebral infarction affecting left non-dominant side: Secondary | ICD-10-CM | POA: Diagnosis not present

## 2017-04-11 DIAGNOSIS — R531 Weakness: Secondary | ICD-10-CM

## 2017-04-11 DIAGNOSIS — F1090 Alcohol use, unspecified, uncomplicated: Secondary | ICD-10-CM

## 2017-04-11 DIAGNOSIS — Z7289 Other problems related to lifestyle: Secondary | ICD-10-CM

## 2017-04-11 DIAGNOSIS — Z720 Tobacco use: Secondary | ICD-10-CM

## 2017-04-11 DIAGNOSIS — Z0001 Encounter for general adult medical examination with abnormal findings: Secondary | ICD-10-CM | POA: Diagnosis not present

## 2017-04-11 DIAGNOSIS — E785 Hyperlipidemia, unspecified: Secondary | ICD-10-CM | POA: Diagnosis not present

## 2017-04-11 MED ORDER — FLUTICASONE PROPIONATE 50 MCG/ACT NA SUSP: 2.0000 | Freq: Every day | NASAL | 1 refills | Status: DC

## 2017-04-11 MED ORDER — ZINC OXIDE 20 % EX OINT: 1.0000 "application " | TOPICAL_OINTMENT | CUTANEOUS | 2 refills | Status: DC | PRN

## 2017-04-11 NOTE — Patient Instructions (Addendum)
It was nice seeing you again today Vincent Byrd!  I have sent you prescription for Flonase to the pharmacy. Please begin using this to help with your nasal congestion.   Today we are measuring your Vitamin D level, your thyroid hormone level, and your cholesterol levels. I will call you when the results of these tests are available.   I have placed a referral to physical therapy for you. When they call to schedule an appointment, please ask about the copay. If you are able to afford it, I think this would be really helpful for you.   Please continue taking your medications as you have been.   If you have any questions or concerns, please feel free to call the clinic.   Be well,  Dr. Natale MilchLancaster

## 2017-04-11 NOTE — Assessment & Plan Note (Signed)
Discussed negative health effects of continued smoking, specifically that this is likely the cause of his chronic cough, and that cough will not improve if he continues smoking. Patient with no desire to quit at this time.  - Continue to encourage cessation at future visits

## 2017-04-11 NOTE — Assessment & Plan Note (Signed)
Still trying to get power wheelchair. [facility], now waiting to hear back from wheelchair company. Is aware that copay will be $1200 and is saving for this.

## 2017-04-11 NOTE — Assessment & Plan Note (Signed)
With associated generalized weakness. Likely due at least in part to poor sleep, as patient drinking 24oz beer prior to bed nightly and having to wake up multiple times throughout the night to urinate. Could likely benefit from physical therapy to improve strength and become more mobile (as much as possible given hemiparesis).  - Referral to PT. If copay too expensive, will defer for now until he has been able to save copay for power wheelchair.  - Vit D, TSH today - Encouraged cutting down on alcohol and not drinking before bed

## 2017-04-11 NOTE — Assessment & Plan Note (Signed)
Reportedly drinking 24oz beer right before bed Sun-Thurs, and one 6 pack of 12 oz beers on Friday and Saturday. Says only drinks liquor intermittently and no wine. Discussed that 2 drinks for men is acceptable, however a six pack in one day is over the recommended amount. Also discussed that this is likely negatively affecting his sleep. Patient said he will try to cut back. Would continue to discuss at future visits.

## 2017-04-11 NOTE — Assessment & Plan Note (Signed)
Not taking atorvastatin.  - Lipid panel today. Will call if abnormal and encourage to resume atorvastatin.

## 2017-04-11 NOTE — Progress Notes (Signed)
57 y.o. year old male presents for well male/preventative visit.  Acute Concerns: Fall Occurred 9 days ago. Larey Seat trying to transfer to commode. Has a non-slip rug which he put in the dryer, and subsequently some of the non-slip pads fell off. He did not hit his head. His upper back rubbed against the ground repeatedly as he tried to maneuver himself back into his chair, which caused some abrasions on his upper back, buttocks, and elbow. He has been putting Neosporin on the abrasion on his upper back. He got a medical alert base system yesterday, and is getting a medical alert necklace button tomorrow.   Fatigue, weakness Reports generalized fatigue and weakness. Not a new issue but worsening. Went to PT a few years ago but said he was told that since he continues to smoke and drink beer it was pointless for him to continue. Is concerned that if he goes now he will be unable to afford the copay, as he is saving up to pay the copay for his electric wheelchair ($1200). Wakes up 2-3 times per night to urinate which he attributes to drinking beer before bed. Does not feel well-rested when he wakes up in the morning.   Congestion Ongoing for past few days. With accompanying cough, though says he always has a "smoker's cough" and it is not worse than usual. Would like something to help with the congestion, not with the cough. Has been afebrile.   HLD Prescribed atorvastatin, though is not taking because he prefers not to.   Tobacco abuse Still smoking one pack per day. Not interested in quitting.   Diet: Thinks could eat more vegetables. Drinks water, ginger ale. Beer every day. Two 12 oz cans before bed on weeknights. Drinks 6 pack 12 oz beers on Fri and Sat. Liquor on holidays. No wine.   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 In power wheelchair.   Sexual History: Not sexually active  Surgical History: Past Surgical History:  Procedure Laterality Date  . APPENDECTOMY     age  66  . TONSILLECTOMY      Allergies: Allergies  Allergen Reactions  . Amoxicillin     Yeast infection caused by either amoxicillin or ampicillin   . Ampicillin     Yeast infection caused by either amoxicillin or ampicillin     Social:  Social History   Socioeconomic History  . Marital status: Single    Spouse name: Not on file  . Number of children: Not on file  . Years of education: Not on file  . Highest education level: Not on file  Social Needs  . Financial resource strain: Not on file  . Food insecurity - worry: Not on file  . Food insecurity - inability: Not on file  . Transportation needs - medical: Not on file  . Transportation needs - non-medical: Not on file  Occupational History  . Not on file  Tobacco Use  . Smoking status: Current Every Day Smoker    Packs/day: 0.75    Types: Cigarettes  . Smokeless tobacco: Never Used  Substance and Sexual Activity  . Alcohol use: Yes    Alcohol/week: 3.6 oz    Types: 6 Cans of beer per week    Comment: occ  . Drug use: No  . Sexual activity: Not on file  Other Topics Concern  . Not on file  Social History Narrative  . Not on file    Immunization: Immunization History  Administered Date(s) Administered  .  Tdap 11/04/2014    Cancer Screening:  Colonoscopy: Declines today  Prostate Exam: Followed by urology for elevated PSA in the past. Up to date.    Physical Exam: VITALS: Reviewed GEN: Pleasant male, NAD; sitting in power wheelchair HEENT: Normocephalic, PERRL, EOMI, no scleral icterus, bilateral TM pearly grey, nasal septum midline, MMM, uvula midline, no anterior or posterior lymphadenopathy, no thyromegaly CARDIAC: RRR, S1 and S2 present, no murmur, no heaves/thrills RESP: CTAB, normal effort, coughing intermittently throughout encounter, no wheezes or crackles, able to speak in full sentences ABD: Soft, no tenderness, normal bowel sounds EXT: No edema, 2+ radial and DP pulses SKIN: Warm and dry,  mild superficial abrasion to L upper back with no surrounding erythema or signs of infection PSYCH: Appropriate mood and affect NEURO: A&Ox3  ASSESSMENT & PLAN: 57 y.o. male presents for annual well male/preventative exam. Please see problem specific assessment and plan.   Dyslipidemia Not taking atorvastatin.  - Lipid panel today. Will call if abnormal and encourage to resume atorvastatin.   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 [facility], now waiting to hear back from wheelchair company. Is aware that copay will be $1200 and is saving for this.   Tobacco abuse Discussed negative health effects of continued smoking, specifically that this is likely the cause of his chronic cough, and that cough will not improve if he continues smoking. Patient with no desire to quit at this time.  - Continue to encourage cessation at future visits  Fatigue With associated generalized weakness. Likely due at least in part to poor sleep, as patient drinking 24oz beer prior to bed nightly and having to wake up multiple times throughout the night to urinate. Could likely benefit from physical therapy to improve strength and become more mobile (as much as possible given hemiparesis).  - Referral to PT. If copay too expensive, will defer for now until he has been able to save copay for power wheelchair.  - Vit D, TSH today - Encouraged cutting down on alcohol and not drinking before bed  Alcohol use Reportedly drinking 24oz beer right before bed Sun-Thurs, and one 6 pack of 12 oz beers on Friday and Saturday. Says only drinks liquor intermittently and no wine. Discussed that 2 drinks for men is acceptable, however a six pack in one day is over the recommended amount. Also discussed that this is likely negatively affecting his sleep. Patient said he will try to cut back. Would continue to discuss at future visits.    Congestion Lungs CTAB and normal WOB on RA. Likely viral. Afebrile and no crackles on exam so less concern for PNA.  - Flonase for symptomatic relief  Fall No head trauma. Getting Life Alert system tomorrow. Abrasion with no signs of infection.  - Continue to inquire about home safety at future appts  Tarri AbernethyAbigail J Harris Kistler, MD, MPH PGY-3 [facility] Pager [phone]

## 2017-04-12 LAB — LIPID PANEL
CHOLESTEROL TOTAL: 118 mg/dL (ref 100–199)
Chol/HDL Ratio: 2.8 ratio (ref 0.0–5.0)
HDL: 42 mg/dL (ref >39)
LDL Calculated: 65 mg/dL (ref 0–99)
Triglycerides: 57 mg/dL (ref 0–149)
VLDL Cholesterol Cal: 11 mg/dL (ref 5–40)

## 2017-04-12 LAB — VITAMIN D 25 HYDROXY (VIT D DEFICIENCY, FRACTURES): VIT D 25 HYDROXY: 9.4 ng/mL — AB (ref 30.0–100.0)

## 2017-04-12 LAB — TSH: TSH: 1.2 u[IU]/mL (ref 0.450–4.500)

## 2017-04-14 ENCOUNTER — Telehealth: Payer: Self-pay | Admitting: Internal Medicine

## 2017-04-14 MED ORDER — VITAMIN D (ERGOCALCIFEROL) 1.25 MG (50000 UNIT) PO CAPS: 50000.0000 [IU] | ORAL_CAPSULE | ORAL | 1 refills | Status: DC

## 2017-04-14 NOTE — Telephone Encounter (Signed)
Called patient to discuss low Vit D. Explained that he needs to begin taking once weekly Vit D supplementation, and that I sent this to his mail order pharmacy. Patient said that he also will be sending a form to us for [employer] Life Alert system. Will be happy to fill this out upon receipt.   Tarri AbernethyAbigail J Salayah Meares, MD, MPH PGY-3 [facility] Pager [phone]

## 2017-04-21 ENCOUNTER — Telehealth: Payer: Self-pay | Admitting: Internal Medicine

## 2017-04-21 NOTE — Telephone Encounter (Signed)
Received form for [employer] regarding Life Alert program. Form completed and faxed back to [employer] at number provided on form per patient's request.   Vincent AbernethyAbigail J Rhylie Stehr, MD, MPH PGY-3 [facility] Pager [phone]

## 2017-05-28 ENCOUNTER — Other Ambulatory Visit: Payer: Self-pay | Admitting: Internal Medicine

## 2017-06-09 ENCOUNTER — Telehealth: Payer: Self-pay

## 2017-06-09 NOTE — Telephone Encounter (Signed)
Pt called nurse line regarding the letter written for him to have his mailbox moved from the street to his house. He states the letter needs to include his address, DOB, phone number and state the fact that he lives alone. Call back number [phone] Shawna Orleans, RN

## 2017-06-09 NOTE — Telephone Encounter (Signed)
Pt calling back about getting a mailbox put on his house because he said it still hasn't been done. Pt said the letter must have his complete address with zip code, complete date of birth, full name with middle initial and stating in detail that he lives alone and needs this mailbox. The letter needs to be faxed to [phone]. Please advise

## 2017-06-10 NOTE — Telephone Encounter (Signed)
Letter regarding mailbox updated with information requested by patient. Will be faxed to number provided by patient.   Tarri Abernethy, MD, MPH PGY-3 [facility] Pager [phone]

## 2017-06-27 DIAGNOSIS — G8194 Hemiplegia, unspecified affecting left nondominant side: Secondary | ICD-10-CM | POA: Diagnosis not present

## 2017-06-27 DIAGNOSIS — I6789 Other cerebrovascular disease: Secondary | ICD-10-CM | POA: Diagnosis not present

## 2017-06-27 DIAGNOSIS — I69354 Hemiplegia and hemiparesis following cerebral infarction affecting left non-dominant side: Secondary | ICD-10-CM | POA: Diagnosis not present

## 2017-07-29 NOTE — Telephone Encounter (Signed)
Copy of last letter mailed to address requested.  [name], RN [facility] RN)

## 2017-07-29 NOTE — Telephone Encounter (Signed)
Pt is calling to inform us that the letter for his mailbox was never received. The postmaster asked for the letter to be mailed instead of faxed. It needs to be sent to Tass, who is the post master at the address : [address] [place] [phone].

## 2017-08-28 ENCOUNTER — Other Ambulatory Visit: Payer: Self-pay | Admitting: Internal Medicine

## 2017-10-20 ENCOUNTER — Telehealth: Payer: Self-pay | Admitting: Family Medicine

## 2017-10-20 NOTE — Telephone Encounter (Signed)
Pt is calling to return a missed call from our office. He said there was no voicemail and he is not sure who called.

## 2017-10-28 ENCOUNTER — Ambulatory Visit: Payer: Medicare HMO

## 2017-11-21 ENCOUNTER — Ambulatory Visit (INDEPENDENT_AMBULATORY_CARE_PROVIDER_SITE_OTHER): Payer: Medicare HMO

## 2017-11-21 VITALS — BP 138/86 | HR 96 | Temp 98.2°F

## 2017-11-21 DIAGNOSIS — Z0000 Encounter for general adult medical examination without abnormal findings: Secondary | ICD-10-CM

## 2017-11-21 NOTE — Patient Instructions (Addendum)
Vincent Byrd , Thank you for taking time to come for your Medicare Wellness Visit. I appreciate your ongoing commitment to your health goals. Please review the following plan we discussed and let me know if I can assist you in the future.   Please keep your appointment with Dr. Primitivo Gauze on 12/01/17.  These are the goals we discussed: Goals    . DIET - EAT MORE FRUITS AND VEGETABLES    . Quit Smoking       This is a list of the screening recommended for you and due dates:  Health Maintenance  Topic Date Due  . Flu Shot  05/28/2018*  . Colon Cancer Screening  01/29/2020  . Tetanus Vaccine  11/03/2024  .  Hepatitis C: One time screening is recommended by Center for Disease Control  (CDC) for  adults born from [birth year] through 1966.   Completed  . HIV Screening  Completed  *Topic was postponed. The date shown is not the original due date.    Steps to Quit Smoking Smoking tobacco can be bad for your health. It can also affect almost every organ in your body. Smoking puts you and people around you at risk for many serious long-lasting (chronic) diseases. Quitting smoking is hard, but it is one of the best things that you can do for your health. It is never too late to quit. What are the benefits of quitting smoking? When you quit smoking, you lower your risk for getting serious diseases and conditions. They can include:  Lung cancer or lung disease.  Heart disease.  Stroke.  Heart attack.  Not being able to have children (infertility).  Weak bones (osteoporosis) and broken bones (fractures).  If you have coughing, wheezing, and shortness of breath, those symptoms may get better when you quit. You may also get sick less often. If you are pregnant, quitting smoking can help to lower your chances of having a baby of low birth weight. What can I do to help me quit smoking? Talk with your doctor about what can help you quit smoking. Some things you can do (strategies) include:  Quitting  smoking totally, instead of slowly cutting back how much you smoke over a period of time.  Going to in-person counseling. You are more likely to quit if you go to many counseling sessions.  Using resources and support systems, such as: ? Agricultural engineer with a Veterinary surgeon. ? Phone quitlines. ? Automotive engineer. ? Support groups or group counseling. ? Text messaging programs. ? Mobile phone apps or applications.  Taking medicines. Some of these medicines may have nicotine in them. If you are pregnant or breastfeeding, do not take any medicines to quit smoking unless your doctor says it is okay. Talk with your doctor about counseling or other things that can help you.  Talk with your doctor about using more than one strategy at the same time, such as taking medicines while you are also going to in-person counseling. This can help make quitting easier. What things can I do to make it easier to quit? Quitting smoking might feel very hard at first, but there is a lot that you can do to make it easier. Take these steps:  Talk to your family and friends. Ask them to support and encourage you.  Call phone quitlines, reach out to support groups, or work with a Veterinary surgeon.  Ask people who smoke to not smoke around you.  Avoid places that make you want (trigger) to smoke, such  as: ? Bars. ? Parties. ? Smoke-break areas at work.  Spend time with people who do not smoke.  Lower the stress in your life. Stress can make you want to smoke. Try these things to help your stress: ? Getting regular exercise. ? Deep-breathing exercises. ? Yoga. ? Meditating. ? Doing a body scan. To do this, close your eyes, focus on one area of your body at a time from head to toe, and notice which parts of your body are tense. Try to relax the muscles in those areas.  Download or buy apps on your mobile phone or tablet that can help you stick to your quit plan. There are many free apps, such as QuitGuide from  the [employer] developer for Disease Control and Prevention). You can find more support from smokefree.gov and other websites.  This information is not intended to replace advice given to you by your health care provider. Make sure you discuss any questions you have with your health care provider. Document Released: 11/10/2008 Document Revised: 09/12/2015 Document Reviewed: 05/31/2014 Elsevier Interactive Patient Education  2018 [employer].

## 2017-11-21 NOTE — Progress Notes (Signed)
Subjective:   Vincent Byrd is a 57 y.o. male who presents for Medicare Annual/Subsequent preventive examination. The patient was informed that the wellness visit is to identify future health risk and educate and initiate measures that can reduce risk for increased disease through the lifespan.   Review of Systems:  Physical assessment deferred to PCP.  Cardiac Risk Factors include: advanced age (>9men, >43 women);smoking/ tobacco exposure;sedentary lifestyle;male gender     Objective:    Vitals: BP 138/86   Pulse 96   Temp 98.2 F (36.8 C) (Oral)   SpO2 98%   There is no height or weight on file to calculate BMI.  Advanced Directives 11/21/2017 03/13/2017 11/29/2016 11/12/2016 07/23/2016 03/26/2016 03/26/2016  Does Patient Have a Medical Advance Directive? No No No No No No No  Would patient like information on creating a medical advance directive? No - Patient declined No - Patient declined No - Patient declined - No - Patient declined - No - Patient declined    Tobacco Social History   Tobacco Use  Smoking Status Current Every Day Smoker  . Packs/day: 0.75  . Types: Cigarettes  Smokeless Tobacco Never Used  Tobacco Comment   wants to discuss further with PCP     Ready to quit: Yes Counseling given: Yes Comment: wants to discuss further with PCP Patient states that he needs to smoke a couple of cigarettes to make his muscles loosen up to help with getting up in the morning; open to patches if insurance will cover.  Clinical Intake:  Pre-visit preparation completed: No  Pain : No/denies pain Pain Score: 0-No pain    Diabetes: No  What is the last grade level you completed in school?: 12th grade, 2 years of college  Interpreter Needed?: No    Past Medical History:  Diagnosis Date  . Hemiparesis (HCC)    affecting left side, secondary to stroke in 1997  . Stroke [facility])    Past Surgical History:  Procedure Laterality Date  . APPENDECTOMY     age 71  .  TONSILLECTOMY     Family History  Problem Relation Age of Onset  . Colon cancer Brother 28  . Diabetes Mother   . Breast cancer Mother    Social History   Socioeconomic History  . Marital status: Divorced    Spouse name: Not on file  . Number of children: 2  . Years of education: some college  . Highest education level: Some college, no degree  Occupational History  . 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 Financial resource strain: Not hard at all  . Food insecurity:    Worry: Never true    Inability: Never true  . Transportation needs:    Medical: No    Non-medical: No  Tobacco Use  . Smoking status: Current Every Day Smoker    Packs/day: 0.75    Types: Cigarettes  . Smokeless tobacco: Never Used  . Tobacco comment: wants to discuss further with PCP  Substance and Sexual Activity  . Alcohol use: Yes    Alcohol/week: 6.0 standard drinks    Types: 6 Cans of beer per week    Comment: 6 on weekend  . Drug use: No  . Sexual activity: Not Currently  Lifestyle  . Physical activity:    Days per week: 0 days    Minutes per session: 0 min  . Stress: Only a little  Relationships  . Social connections:  Talks on phone: Never    Gets together: More than three times a week    Attends religious service: Never    Active member of club or organization: Yes    Attends meetings of clubs or organizations: More than 4 times per year    Relationship status: Divorced  Other Topics Concern  . Not on file  Social History Narrative   Lives with son. Lives in house, one level, has step in shower, has a ramp for wheelchair. Has grab bars in restroom. Smoke alarms.      Has medical alert button and a home health aide 4 days a week for 2 hours. Would like to have HH aide 7 days a week.      Has a modified [name] he drives and uses [employer].      Has a chihuahua named JoJo.       Likes to sit on porch, likes to listen to radio.      Likes to eat mostly  meats, wants to add in vegetables, eats some fruits, has a sweet tooth. Likes ginger ale and beer.      Wears seat belt in vehicle.     Outpatient Encounter Medications as of 11/21/2017  Medication Sig  . aspirin 325 MG tablet Take 325 mg by mouth every other day.   . baclofen (LIORESAL) 10 MG tablet TAKE 1/2 TABLET (5 MG TOTAL) BY MOUTH 3 (THREE) TIMES DAILY AS NEEDED FOR MUSCLE SPASMS.  . fluticasone (FLONASE) 50 MCG/ACT nasal spray Place 2 sprays into both nostrils daily.  . meloxicam (MOBIC) 15 MG tablet TAKE 1 TABLET EVERY DAY AS NEEDED FOR PAIN  . Vitamin D, Ergocalciferol, (DRISDOL) 50000 units CAPS capsule TAKE 1 CAPSULE EVERY 7 DAYS  . atorvastatin (LIPITOR) 40 MG tablet TAKE 1 TABLET EVERY DAY (Patient not taking: Reported on 11/21/2017)  . zinc oxide (MEIJER ZINC OXIDE) 20 % ointment Apply 1 application topically as needed for irritation. (Patient not taking: Reported on 11/21/2017)   No facility-administered encounter medications on file as of 11/21/2017.     Activities of Daily Living In your present state of health, do you have any difficulty performing the following activities: 11/21/2017  Hearing? N  Vision? N  Difficulty concentrating or making decisions? N  Walking or climbing stairs? Y  Dressing or bathing? Y  Doing errands, shopping? Y  Preparing Food and eating ? Y  Using the Toilet? Y  In the past six months, have you accidently leaked urine? N  Do you have problems with loss of bowel control? N  Managing your Medications? Y  Managing your Finances? Y  Housekeeping or managing your Housekeeping? Y  Some recent data might be hidden    Patient Care Team: Myrene Buddy, MD as PCP - General (Family Medicine)   Assessment:   This is a routine wellness examination for Vincent Byrd.  Exercise Activities and Dietary recommendations Current Exercise Habits: The patient does not participate in regular exercise at present, Exercise limited by: orthopedic  condition(s);neurologic condition(s)  Goals    . DIET - EAT MORE FRUITS AND VEGETABLES    . Quit Smoking       Fall Risk Fall Risk  11/21/2017 03/13/2017 03/26/2016 05/24/2014  Falls in the past year? No No No Yes  Number falls in past yr: - - - 1  Injury with Fall? - - - No  Risk for fall due to : Impaired mobility;Impaired balance/gait - - History of fall(s);Impaired mobility;Impaired balance/gait  Follow  up - - - Falls prevention discussed   Is the patient's home free of loose throw rugs in walkways, pet beds, electrical cords, etc?   yes      Grab bars in the bathroom? yes      Handrails on the stairs?   yes      Adequate lighting?   yes   Depression Screen PHQ 2/9 Scores 11/21/2017 03/13/2017 07/23/2016 03/26/2016  PHQ - 2 Score 0 0 0 0    Cognitive Function MMSE - Mini Mental State Exam 11/21/2017  Orientation to time 5  Orientation to Place 5  Registration 3  Attention/ Calculation 5  Recall 3  Language- name 2 objects 2  Language- repeat 1  Language- follow 3 step command 3  Language- read & follow direction 1  Write a sentence 1  Copy design 1  Total score 30     6CIT Screen 11/21/2017  What Year? 0 points  What month? 0 points  What time? 0 points  Count back from 20 0 points  Months in reverse 0 points  Repeat phrase 0 points  Total Score 0    Immunization History  Administered Date(s) Administered  . Tdap 11/04/2014    Screening Tests Health Maintenance  Topic Date Due  . INFLUENZA VACCINE  05/28/2018 (Originally 08/28/2017)  . COLONOSCOPY  01/29/2020  . TETANUS/TDAP  11/03/2024  . Hepatitis C Screening  Completed  . HIV Screening  Completed   Cancer Screenings: Lung: Low Dose CT Chest recommended if Age 2-80 years, 30 pack-year currently smoking OR have quit w/in 15years. Patient does qualify. Colorectal: Patient stated he had colonoscopy at [facility] at age 55. Will call for record.  Additional Screenings:  Hepatitis C Screening: complete        Plan:    Patient declined flu vaccine. Has appt with new PCP on 12/01/17. Would like to discuss smoking cessation. Will attempt to get report of colonoscopy from [facility].  I have personally reviewed and noted the following in the patient's chart:   . Medical and social history . Use of alcohol, tobacco or illicit drugs  . Current medications and supplements . Functional ability and status . Nutritional status . Physical activity . Advanced directives . List of other physicians . Hospitalizations, surgeries, and ER visits in previous 12 months . Vitals . Screenings to include cognitive, depression, and falls . Referrals and appointments  In addition, I have reviewed and discussed with patient certain preventive protocols, quality metrics, and best practice recommendations. A written personalized care plan for preventive services as well as general preventive health recommendations were provided to patient.     Nigel Mormon, RN  11/21/2017

## 2017-12-01 ENCOUNTER — Encounter: Payer: Self-pay | Admitting: Family Medicine

## 2017-12-01 ENCOUNTER — Other Ambulatory Visit: Payer: Self-pay

## 2017-12-01 ENCOUNTER — Ambulatory Visit (INDEPENDENT_AMBULATORY_CARE_PROVIDER_SITE_OTHER): Payer: Medicare HMO | Admitting: Family Medicine

## 2017-12-01 VITALS — BP 142/72 | HR 92 | Temp 98.3°F

## 2017-12-01 DIAGNOSIS — I69354 Hemiplegia and hemiparesis following cerebral infarction affecting left non-dominant side: Secondary | ICD-10-CM

## 2017-12-01 DIAGNOSIS — Z0000 Encounter for general adult medical examination without abnormal findings: Secondary | ICD-10-CM | POA: Diagnosis not present

## 2017-12-01 DIAGNOSIS — Z716 Tobacco abuse counseling: Secondary | ICD-10-CM

## 2017-12-01 DIAGNOSIS — E785 Hyperlipidemia, unspecified: Secondary | ICD-10-CM | POA: Diagnosis not present

## 2017-12-01 MED ORDER — NICOTINE 14 MG/24HR TD PT24: 14.0000 mg | MEDICATED_PATCH | Freq: Every day | TRANSDERMAL | 0 refills | Status: DC

## 2017-12-01 NOTE — Progress Notes (Signed)
   HPI 57 year old male who presents for smoking cessation as well as to meet new PCP. Patient states that he usually smokes about 2/3 pack per day. He states that he has to immediately reach for a cigarette to even get out of bed in the morning. He does not wake up very often to smoke. He has tried chantix in the past but states that it was too expensive. He feels that nicotine patches should help him get past his cravings.  Patient has no other complaints but would like some blood work done to make sure he is doing ok. Will get cbc, cmp, lipid panel, a1c.  Patient still has residual left sided deficits from a previous stroke. His right side is unaffected but he has noticeable weakness on the left leaving him wheelchair bound.  CC: smoking cessation   ROS:   Review of Systems See HPI for ROS.   CC, SH/smoking status, and VS noted  Objective: BP (!) 142/72   Pulse 92   Temp 98.3 F (36.8 C) (Oral)   SpO2 58%  Gen: 57 year old AA male in no acute distress. HEENT: NCAT, EOMI, PERRL CV: RRR, no murmur. Palpable peripheral pulses. Resp: CTAB, no wheezes, non-labored Abd: SNTND, BS present, no guarding or organomegaly Ext: No edema, warm Neuro: 2/5 strength LLE and LUE. 5/5 strength in RUE, RLE. Cn 2-12 intact. Wheelchair bound  Assessment and plan:  Hemiparesis affecting left side as late effect of stroke [facility]) Did get his power wheelchair. Residual deficits unchanged. Lipid panel, cbc, cmp look good.  Healthcare maintenance Cbc, cmp, lipid panel reviewed. Workup looks good.  Encounter for smoking cessation counseling Patient interested in smoking cessation. F[place]strom of 6. Will be difficult for him to quit. Chantix too expensive. Apparently does take wellbutrin even though not on list. Gave card for 1-800-QUIT-NOW. Script for patches sent to pharmacy. - follow up as needed   Orders Placed This Encounter  Procedures  . CBC with Differential  . Lipid panel    Order  Specific Question:   Has the patient fasted?    Answer:   No  . Comprehensive metabolic panel    Order Specific Question:   Has the patient fasted?    Answer:   No    Meds ordered this encounter  Medications  . nicotine (NICODERM CQ - DOSED IN MG/24 HOURS) 14 mg/24hr patch    Sig: Place 1 patch (14 mg total) onto the skin daily.    Dispense:  28 patch    Refill:  0     Myrene Buddy MD PGY-2 Family Medicine Resident  12/04/2017 11:21 AM

## 2017-12-01 NOTE — Patient Instructions (Addendum)
It was great meeting you today! I am glad that things have been going well. I sent in a prescription for nicotine patches. This is combination with your wellbutrin should help control those cravings. I would also advise called 1-800-QUIT-NOW. They have trained counselors that can help advise you when you cravings are the worst. You can also sometimes gets some free samples. I will be checking your blood counts, liver function, kidney function, and cholesterol. These results should be back in 48 hours, I will call you with the results.

## 2017-12-02 LAB — COMPREHENSIVE METABOLIC PANEL
A/G RATIO: 1.7 (ref 1.2–2.2)
ALT: 16 IU/L (ref 0–44)
AST: 21 IU/L (ref 0–40)
Albumin: 4.7 g/dL (ref 3.5–5.5)
Alkaline Phosphatase: 60 IU/L (ref 39–117)
BUN/Creatinine Ratio: 10 (ref 9–20)
BUN: 8 mg/dL (ref 6–24)
Bilirubin Total: 1.1 mg/dL (ref 0.0–1.2)
CALCIUM: 9.6 mg/dL (ref 8.7–10.2)
CHLORIDE: 97 mmol/L (ref 96–106)
CO2: 26 mmol/L (ref 20–29)
Creatinine, Ser: 0.81 mg/dL (ref 0.76–1.27)
GFR calc non Af Amer: 99 mL/min/{1.73_m2} (ref >59)
GFR, EST AFRICAN AMERICAN: 114 mL/min/{1.73_m2} (ref >59)
GLUCOSE: 89 mg/dL (ref 65–99)
Globulin, Total: 2.7 g/dL (ref 1.5–4.5)
POTASSIUM: 3.9 mmol/L (ref 3.5–5.2)
Sodium: 140 mmol/L (ref 134–144)
Total Protein: 7.4 g/dL (ref 6.0–8.5)

## 2017-12-02 LAB — LIPID PANEL
CHOL/HDL RATIO: 1.8 ratio (ref 0.0–5.0)
Cholesterol, Total: 116 mg/dL (ref 100–199)
HDL: 65 mg/dL (ref >39)
LDL CALC: 43 mg/dL (ref 0–99)
Triglycerides: 40 mg/dL (ref 0–149)
VLDL Cholesterol Cal: 8 mg/dL (ref 5–40)

## 2017-12-02 LAB — CBC WITH DIFFERENTIAL/PLATELET
Basophils Absolute: 0 10*3/uL (ref 0.0–0.2)
Basos: 0 %
EOS (ABSOLUTE): 0 10*3/uL (ref 0.0–0.4)
Eos: 0 %
HEMATOCRIT: 54.6 % — AB (ref 37.5–51.0)
Hemoglobin: 18.1 g/dL — ABNORMAL HIGH (ref 13.0–17.7)
Immature Grans (Abs): 0 10*3/uL (ref 0.0–0.1)
Immature Granulocytes: 0 %
LYMPHS ABS: 1.5 10*3/uL (ref 0.7–3.1)
Lymphs: 24 %
MCH: 28.8 pg (ref 26.6–33.0)
MCHC: 33.2 g/dL (ref 31.5–35.7)
MCV: 87 fL (ref 79–97)
MONOCYTES: 8 %
MONOS ABS: 0.5 10*3/uL (ref 0.1–0.9)
NEUTROS ABS: 4.1 10*3/uL (ref 1.4–7.0)
Neutrophils: 68 %
Platelets: 167 10*3/uL (ref 150–450)
RBC: 6.29 x10E6/uL — ABNORMAL HIGH (ref 4.14–5.80)
RDW: 13.2 % (ref 12.3–15.4)
WBC: 6.1 10*3/uL (ref 3.4–10.8)

## 2017-12-04 DIAGNOSIS — Z716 Tobacco abuse counseling: Secondary | ICD-10-CM | POA: Insufficient documentation

## 2017-12-04 NOTE — Assessment & Plan Note (Signed)
Did get his power wheelchair. Residual deficits unchanged. Lipid panel, cbc, cmp look good.

## 2017-12-04 NOTE — Assessment & Plan Note (Signed)
Cbc, cmp, lipid panel reviewed. Workup looks good.

## 2017-12-04 NOTE — Assessment & Plan Note (Signed)
Patient interested in smoking cessation. Fagesrstrom of 6. Will be difficult for him to quit. Chantix too expensive. Apparently does take wellbutrin even though not on list. Gave card for 1-800-QUIT-NOW. Script for patches sent to pharmacy. - follow up as needed

## 2017-12-23 NOTE — Progress Notes (Signed)
I agree with the documentation and care plan.  Myrene BuddyJacob Talene Glastetter MD PGY-2 Family Medicine Resident

## 2018-01-19 ENCOUNTER — Other Ambulatory Visit: Payer: Self-pay | Admitting: Family Medicine

## 2018-04-06 ENCOUNTER — Telehealth: Payer: Self-pay | Admitting: *Deleted

## 2018-04-06 NOTE — Telephone Encounter (Signed)
Pt calls to check status of his handicap placard that he mailed to clinic.  The form is in providers box.  He would like this mailed to him when finished. Vincent Byrd, [name], CMA

## 2018-04-06 NOTE — Telephone Encounter (Signed)
Signed handicap placard form and placed in mailbox to be mailed.  Myrene Buddy MD PGY-2 Family Medicine Resident

## 2018-04-27 ENCOUNTER — Telehealth: Payer: Self-pay | Admitting: Family Medicine

## 2018-04-27 NOTE — Telephone Encounter (Signed)
Patient called stating that he will put form form [employer] in doctors box so that his services doesn't get interrupted. Please give a patient a call back.

## 2018-04-28 NOTE — Telephone Encounter (Signed)
Pt calls to inform us that he put the forms in the mail today.  He would like them to be faxed back to [facility] [employer] @ [phone]. Jone Baseman, CMA

## 2018-04-29 NOTE — Telephone Encounter (Signed)
Checked box and no forms yet. Will fill out and send in once the form appears.  Myrene Buddy MD PGY-2 Family Medicine Resident

## 2018-05-06 NOTE — Telephone Encounter (Signed)
Filled out form and mailed to [employer]  Myrene Buddy MD PGY-2 Family Medicine Resident

## 2018-05-21 NOTE — Telephone Encounter (Signed)
Pt called nurse line stating [employer] rejected the form. Pt stated there was some missing information. The form will be mailed back to [place] with highlighted areas for changes or more information. Will await the letter.

## 2018-05-29 ENCOUNTER — Telehealth: Payer: Self-pay

## 2018-05-29 NOTE — Telephone Encounter (Signed)
Pt called nurse line requesting a letter for jury summons excusat for 07/28/2018 for his medical conditions. Once written please mail to him.

## 2018-06-02 NOTE — Telephone Encounter (Signed)
Excuse written and mailed to paitient

## 2018-07-22 ENCOUNTER — Other Ambulatory Visit: Payer: Self-pay | Admitting: Family Medicine

## 2018-08-05 ENCOUNTER — Telehealth: Payer: Self-pay | Admitting: *Deleted

## 2018-08-05 NOTE — Telephone Encounter (Signed)
Pt states that his aide was positive for covid on Thursday of last week.  His last interaction with her was on that Wednesday.  Pt wonders of he should be tested.  Pt is asymptomatic at this time.  He is agreeable to telemedicine visit for covid test order but does not have a ride to the test site (other than scat).    [place] agency is requesting that he get tested before they send out another aide.  For now, he will watch news for testing site info that he can go to without an order.   He will continue to self isolate for another week.  Christen Bame, CMA

## 2018-08-25 ENCOUNTER — Telehealth: Payer: Self-pay | Admitting: *Deleted

## 2018-08-25 NOTE — Telephone Encounter (Signed)
Pt lm on nurse line stating, " I need a referral to a podiatrist"  Attempted to call back, LMOVM asking pt to return call to get more info. Christen Bame, CMA

## 2018-08-27 ENCOUNTER — Telehealth: Payer: Self-pay | Admitting: Family Medicine

## 2018-08-27 DIAGNOSIS — L602 Onychogryphosis: Secondary | ICD-10-CM

## 2018-08-27 NOTE — Telephone Encounter (Signed)
Pt is calling and would like his doctor to refer him to [facility]. jw

## 2018-09-03 NOTE — Telephone Encounter (Signed)
Made referral to [facility]  Guadalupe Dawn MD PGY-3 Family Medicine Resident

## 2018-09-10 ENCOUNTER — Telehealth: Payer: Self-pay | Admitting: Podiatry

## 2018-09-10 ENCOUNTER — Ambulatory Visit (INDEPENDENT_AMBULATORY_CARE_PROVIDER_SITE_OTHER): Payer: Medicare HMO | Admitting: Podiatry

## 2018-09-10 ENCOUNTER — Other Ambulatory Visit: Payer: Self-pay

## 2018-09-10 DIAGNOSIS — L603 Nail dystrophy: Secondary | ICD-10-CM | POA: Diagnosis not present

## 2018-09-10 DIAGNOSIS — B353 Tinea pedis: Secondary | ICD-10-CM | POA: Diagnosis not present

## 2018-09-10 DIAGNOSIS — B351 Tinea unguium: Secondary | ICD-10-CM | POA: Diagnosis not present

## 2018-09-10 MED ORDER — CICLOPIROX 8 % EX SOLN: Freq: Every day | CUTANEOUS | 0 refills | Status: DC

## 2018-09-10 NOTE — Progress Notes (Signed)
Subjective:  Patient ID: Vincent Byrd, male    DOB: Mar 30, 1960,  MRN: [id number]  Chief Complaint  Patient presents with  . Nail Problem    Nail fungus 1-5 bilateral. Pt requests trim and new treatment, has been using OTC medications with no results.  . Callouses    Right plantar heel callous.    58 y.o. male presents with the above complaint. 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 States that he was fitted for new boots and that they caused his toenails to bleed and thinks that this caused him to contract the fungus. States he has had issues ever since. Has used athlete's foot cream without relief.  States the [place] lost his records regarding the above. States he is in discussion with the [facility] for this condition and would like assistance with certification paperwork in that regard.   Review of Systems: Negative except as noted in the HPI. Denies N/V/F/Ch.  Past Medical History:  Diagnosis Date  . Hemiparesis ([place])    affecting left side, secondary to stroke in 1997  . Stroke [facility])     Current Outpatient Medications:  .  aspirin 325 MG tablet, Take 325 mg by mouth every other day. , Disp: , Rfl:  .  atorvastatin (LIPITOR) 40 MG tablet, TAKE 1 TABLET EVERY DAY, Disp: 90 tablet, Rfl: 2 .  baclofen (LIORESAL) 10 MG tablet, TAKE 1/2 TABLET (5 MG TOTAL) BY MOUTH 3 (THREE) TIMES DAILY AS NEEDED FOR MUSCLE SPASMS., Disp: 45 tablet, Rfl: 2 .  fluticasone (FLONASE) 50 MCG/ACT nasal spray, Place 2 sprays into both nostrils daily., Disp: 16 g, Rfl: 1 .  meloxicam (MOBIC) 15 MG tablet, TAKE 1 TABLET EVERY DAY AS NEEDED FOR PAIN, Disp: 30 tablet, Rfl: 5 .  nicotine (NICODERM CQ - DOSED IN MG/24 HOURS) 14 mg/24hr patch, Place 1 patch (14 mg total) onto the skin daily., Disp: 28 patch, Rfl: 0 .  Vitamin D, Ergocalciferol, (DRISDOL) 1.25 MG (50000 UT) CAPS capsule, TAKE 1 CAPSULE EVERY 7 DAYS, Disp: 13 capsule, Rfl: 1 .  zinc oxide (MEIJER ZINC OXIDE) 20 % ointment,  Apply 1 application topically as needed for irritation., Disp: 56 g, Rfl: 2 .  ciclopirox (PENLAC) 8 % solution, Apply topically at bedtime. Apply over nail and surrounding skin. Apply daily over previous coat. Remove weekly with file or polish remover., Disp: 6.6 mL, Rfl: 0 .  ciclopirox (PENLAC) 8 % solution, Apply topically at bedtime. Apply over nail and surrounding skin. Apply daily over previous coat. Remove weekly with file or polish remover., Disp: 6.6 mL, Rfl: 0  Social History   Tobacco Use  Smoking Status Current Every Day Smoker  . Packs/day: 0.75  . Types: Cigarettes  Smokeless Tobacco Never Used  Tobacco Comment   wants to discuss further with PCP    Allergies  Allergen Reactions  . Amoxicillin     Yeast infection caused by either amoxicillin or ampicillin   . Ampicillin     Yeast infection caused by either amoxicillin or ampicillin    Objective:  There were no vitals filed for this visit. There is no height or weight on file to calculate BMI. Constitutional Well developed. Well nourished.  Vascular Dorsalis pedis pulses palpable bilaterally. Posterior tibial pulses palpable bilaterally. Capillary refill normal to all digits.  No cyanosis or clubbing noted. Pedal hair growth normal.  Neurologic Normal speech. Oriented to person, place, and time. Epicritic sensation to light touch  grossly present bilaterally.  Dermatologic Nails thickenend, dystrophic, black discoloration. Skin callus noted plantar right heel  Orthopedic: Normal joint ROM without pain or crepitus bilaterally. No visible deformities. No bony tenderness.   Radiographs: None Assessment:   1. Onychomycosis   2. Nail dystrophy   3. Tinea pedis of both feet    Plan:  Patient was evaluated and treated and all questions answered.  Onychomycosis -Educated on etiology -Rx Penlac. Educated on use. Patient's aid to apply -Requested routine care, advised of likely non-coverage, patient declined.   Patient would like me to certify his nail fungus as likely service connected with the [facility]. While patient states he has had this since the 480s and [place]. As I am not familiar with VA standards I recommend he see a VA physician. Moreover, the forms patent would like me to certify are regards to disability, and it is not medically warranted that this condition be disabling.  No follow-ups on file.

## 2018-09-10 NOTE — Telephone Encounter (Signed)
Upon check out patient informed that he gave Dr Luan Pulling papers to be completed. He says he wants those papers for a legal case and would like them mailed to his address once they are faxed to [place] please

## 2018-09-11 NOTE — Telephone Encounter (Signed)
Dr. March Rummage states there is no way of determining if the onychomycosis was caused by [place] service, pt needs to return to the [place]. Left message informing pt of Dr. Eleanora Neighbor statement.

## 2018-09-14 ENCOUNTER — Telehealth: Payer: Self-pay | Admitting: *Deleted

## 2018-09-14 NOTE — Telephone Encounter (Signed)
Pt states he received a call stating Dr. 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

## 2018-09-14 NOTE — Telephone Encounter (Signed)
Left message informing pt the requested returned forms would be in the mail today. Mailed forms to pt's listed home address.

## 2018-12-26 ENCOUNTER — Other Ambulatory Visit: Payer: Self-pay | Admitting: Family Medicine

## 2019-02-01 ENCOUNTER — Telehealth: Payer: Self-pay | Admitting: Podiatry

## 2019-02-01 MED ORDER — CICLOPIROX 8 % EX SOLN: Freq: Every day | CUTANEOUS | 0 refills | Status: DC

## 2019-02-01 NOTE — Telephone Encounter (Signed)
Pt called requesting a refill on Penlac solution. Please use [facility]

## 2019-02-01 NOTE — Telephone Encounter (Signed)
Refill sent to [facility] 

## 2019-02-01 NOTE — Addendum Note (Signed)
Addended by: Alphia Kava D on: 02/01/2019 05:19 PM   Modules accepted: Orders

## 2019-02-22 ENCOUNTER — Other Ambulatory Visit: Payer: Self-pay | Admitting: Podiatry

## 2019-03-10 ENCOUNTER — Other Ambulatory Visit: Payer: Self-pay

## 2019-03-10 ENCOUNTER — Encounter: Payer: Self-pay | Admitting: Family Medicine

## 2019-03-10 ENCOUNTER — Ambulatory Visit (INDEPENDENT_AMBULATORY_CARE_PROVIDER_SITE_OTHER): Payer: Medicare HMO | Admitting: Family Medicine

## 2019-03-10 DIAGNOSIS — I69354 Hemiplegia and hemiparesis following cerebral infarction affecting left non-dominant side: Secondary | ICD-10-CM | POA: Diagnosis not present

## 2019-03-10 DIAGNOSIS — L739 Follicular disorder, unspecified: Secondary | ICD-10-CM | POA: Diagnosis not present

## 2019-03-10 NOTE — Assessment & Plan Note (Addendum)
Patient with residual bumps likely due to pseudofolliculitis barbae. None appear infected. Recommended not getting it shaved down to skin level.

## 2019-03-10 NOTE — Assessment & Plan Note (Signed)
Residual deficits unchanged. Will fill out scat form for permanent disability.

## 2019-03-10 NOTE — Progress Notes (Signed)
   HPI 59 year old male who presents for SCAT form resignature and bumps on his head. The patient is a hemiplegic 2/2 previous stroke who needs his SCAT form recertified.   The patient has developed bumps on his head. He states that they have been there for a few weeks. They started after his most recent hair cut. He gets his head shaved down to the skin level when he goes. The bumps are no longer painful. They are located on his bilateral temple area and a few on his posterior head  CC: bumps on head, scat form re-cert   ROS:   Review of Systems See HPI for ROS.   CC, SH/smoking status, and VS noted  Objective: BP 102/72   Pulse 80   SpO2 80%  Gen: 59 year old male, no acute distress, comfortable in wheelchair CV: rrr, no mr/g/ Resp: lungs ctab aside from very faint wheezing, no accessory muscle use Neuro: Alert and oriented, speech normal, known hemiplegia BLE, no other focal deficits appreciated Head: small, flesh colored bumps noted on bilateral temples and posterior head. Able to see ingrown hair within a few of these.   Assessment and plan:  Hemiparesis affecting left side as late effect of stroke (HCC) Residual deficits unchanged. Will fill out scat form for permanent disability.  Pseudofolliculitis barbae Patient with residual bumps likely due to pseudofolliculitis barbae. None appear infected. Recommended not getting it shaved down to skin level.   No orders of the defined types were placed in this encounter.   No orders of the defined types were placed in this encounter.    Myrene Buddy MD PGY-3 Family Medicine Resident  03/10/2019 8:10 PM

## 2019-03-10 NOTE — Patient Instructions (Signed)
It was great seeing you again today!  I will get your prescription form filled out and sent to you [place].  Your bumps are called pseudofolliculitis barbae.  It is due to getting your hair cut skin tight, with subsequent ingrown hairs forming.  I recommend getting it cut a little shorter next time.

## 2019-04-08 ENCOUNTER — Ambulatory Visit: Payer: Medicare HMO | Admitting: Family Medicine

## 2019-04-13 ENCOUNTER — Ambulatory Visit (INDEPENDENT_AMBULATORY_CARE_PROVIDER_SITE_OTHER): Payer: Medicare HMO | Admitting: Family Medicine

## 2019-04-13 ENCOUNTER — Encounter: Payer: Self-pay | Admitting: Family Medicine

## 2019-04-13 ENCOUNTER — Other Ambulatory Visit: Payer: Self-pay

## 2019-04-13 VITALS — BP 130/72 | HR 81

## 2019-04-13 DIAGNOSIS — L739 Follicular disorder, unspecified: Secondary | ICD-10-CM

## 2019-04-13 DIAGNOSIS — E785 Hyperlipidemia, unspecified: Secondary | ICD-10-CM | POA: Diagnosis not present

## 2019-04-13 DIAGNOSIS — R202 Paresthesia of skin: Secondary | ICD-10-CM | POA: Insufficient documentation

## 2019-04-13 DIAGNOSIS — D751 Secondary polycythemia: Secondary | ICD-10-CM

## 2019-04-13 DIAGNOSIS — R5383 Other fatigue: Secondary | ICD-10-CM | POA: Diagnosis not present

## 2019-04-13 DIAGNOSIS — L819 Disorder of pigmentation, unspecified: Secondary | ICD-10-CM | POA: Diagnosis not present

## 2019-04-13 DIAGNOSIS — G629 Polyneuropathy, unspecified: Secondary | ICD-10-CM

## 2019-04-13 DIAGNOSIS — R739 Hyperglycemia, unspecified: Secondary | ICD-10-CM

## 2019-04-13 DIAGNOSIS — R972 Elevated prostate specific antigen [PSA]: Secondary | ICD-10-CM

## 2019-04-13 DIAGNOSIS — G609 Hereditary and idiopathic neuropathy, unspecified: Secondary | ICD-10-CM | POA: Diagnosis not present

## 2019-04-13 DIAGNOSIS — Z8673 Personal history of transient ischemic attack (TIA), and cerebral infarction without residual deficits: Secondary | ICD-10-CM

## 2019-04-13 DIAGNOSIS — I10 Essential (primary) hypertension: Secondary | ICD-10-CM | POA: Diagnosis not present

## 2019-04-13 DIAGNOSIS — E7800 Pure hypercholesterolemia, unspecified: Secondary | ICD-10-CM | POA: Diagnosis not present

## 2019-04-13 LAB — POCT GLYCOSYLATED HEMOGLOBIN (HGB A1C): Hemoglobin A1C: 5.1 % (ref 4.0–5.6)

## 2019-04-13 MED ORDER — KETOCONAZOLE 2 % EX CREA: 1.0000 "application " | TOPICAL_CREAM | Freq: Every day | CUTANEOUS | 2 refills | Status: DC

## 2019-04-13 NOTE — Assessment & Plan Note (Signed)
Atorvastatin 40 mg on medication list but he is not taking this per his report.  Lipid panel looked great 1 year ago, will recheck.

## 2019-04-13 NOTE — Assessment & Plan Note (Signed)
Patient states that urology stated that any prostate problem was benign.  Has been lost to follow-up with that.  Could consider getting PSA at next visit.

## 2019-04-13 NOTE — Assessment & Plan Note (Addendum)
Hgb > 18 at last check. Likely due to chronic smoking. Will recheck cbc and see if this persists. Could consider additional workup such as smear pending results.

## 2019-04-13 NOTE — Patient Instructions (Addendum)
It was great seeing you today!  I think those black spots on her feet are likely due to a fungal infection.  We can do a little bit stronger medication than what you are currently doing, I sent in something called ketoconazole cream.  You will apply this only for 6 weeks.  We will also get some lab work to check your blood counts, kidney/liver function, cholesterol panel.  I will give you call with these results.  I am also going to add on a vitamin B12 and folate level to see if these are causing some neuropathy.  Lastly I would encourage you to go back to see podiatry for follow up.

## 2019-04-13 NOTE — Assessment & Plan Note (Signed)
Not an atypical distribution for vascular pathology.  Likely secondary to tinea infection.  We will switch from ciclopirox to ketoconazole cream.  1 application for 6 weeks.  I did encourage him to follow-up with podiatry for follow-up and further management.Marland Kitchen

## 2019-04-13 NOTE — Assessment & Plan Note (Signed)
This has improved per patient report.  States he is not having any bumps.

## 2019-04-13 NOTE — Assessment & Plan Note (Signed)
Patient with tingling in both feet.  Mocussy.1. will check vitamin B12 and folate level to see if there are alternative causes for neuropathy.  Could potentially just be sequelae of his known stroke and extreme rigidity.

## 2019-04-13 NOTE — Progress Notes (Signed)
   CHIEF COMPLAINT / HPI: 59 year old male who presents for foot itching, dark discoloration, diabetes concerns.  The patient states that his feet have been itching and having some general discomfort especially at night. He was seen by the podiatrist who prescribe him ciclopirox. He states that using this has been helpful, but more-so on his toenails. He has chronic thickening of his toenails but states that perhaps the ciclopirox helps. He states that his podiatrist told him that he couldn't cut his nails because he doesn't have diabetes.  Of note the patient is overdue for a lipid panel and a cbc. He has polycythemia on 11/4. The patient also complains of some neuropathic type symptoms of his legs. Of note the patient does not have any symptoms of burning pain but states that he does have some tingling usually at night.  PERTINENT  PMH / PSH: History of stroke  OBJECTIVE: BP 130/72   Pulse 81   SpO2 69%   Gen: 59 year old male, no acute distress, very pleasant CV: rrr, no m/r/g Resp: lungs clear to auscultation bilaterally, no accessory muscle use Neuro: rigidity noted in BLE. BLE: pt/dp palpable bilaterally, marked rigidity noted BLE. Hyperpigmentation noted in circular distributions Bilateral feet.   ASSESSMENT / PLAN:  Pseudofolliculitis barbae This has improved per patient report.  States he is not having any bumps.  Tingling of both feet Patient with tingling in both feet.  Mocussy.1. will check vitamin B12 and folate level to see if there are alternative causes for neuropathy.  Could potentially just be sequelae of his known stroke and extreme rigidity.   Polycythemia Hgb > 18 at last check. Likely due to chronic smoking. Will recheck cbc and see if this persists. Could consider additional workup such as smear pending results.  Elevated PSA Patient states that urology stated that any prostate problem was benign.  Has been lost to follow-up with that.  Could consider getting  PSA at next visit.  Dyslipidemia Atorvastatin 40 mg on medication list but he is not taking this per his report.  Lipid panel looked great 1 year ago, will recheck.  Discoloration of skin of foot Not an atypical distribution for vascular pathology.  Likely secondary to tinea infection.  We will switch from ciclopirox to ketoconazole cream.  1 application for 6 weeks.  I did encourage him to follow-up with podiatry for follow-up and further management.Myrene Buddy MD PGY-3 Family Medicine Resident [facility]

## 2019-04-14 LAB — CBC WITH DIFFERENTIAL/PLATELET
Basophils Absolute: 0 10*3/uL (ref 0.0–0.2)
Basos: 0 %
EOS (ABSOLUTE): 0 10*3/uL (ref 0.0–0.4)
Eos: 1 %
Hematocrit: 54.9 % — ABNORMAL HIGH (ref 37.5–51.0)
Hemoglobin: 18.5 g/dL — ABNORMAL HIGH (ref 13.0–17.7)
Immature Grans (Abs): 0 10*3/uL (ref 0.0–0.1)
Immature Granulocytes: 0 %
Lymphocytes Absolute: 1.1 10*3/uL (ref 0.7–3.1)
Lymphs: 34 %
MCH: 29.6 pg (ref 26.6–33.0)
MCHC: 33.7 g/dL (ref 31.5–35.7)
MCV: 88 fL (ref 79–97)
Monocytes Absolute: 0.3 10*3/uL (ref 0.1–0.9)
Monocytes: 10 %
Neutrophils Absolute: 1.8 10*3/uL (ref 1.4–7.0)
Neutrophils: 55 %
Platelets: 181 10*3/uL (ref 150–450)
RBC: 6.25 x10E6/uL — ABNORMAL HIGH (ref 4.14–5.80)
RDW: 12.9 % (ref 11.6–15.4)
WBC: 3.3 10*3/uL — ABNORMAL LOW (ref 3.4–10.8)

## 2019-04-14 LAB — COMPREHENSIVE METABOLIC PANEL
ALT: 15 IU/L (ref 0–44)
AST: 16 IU/L (ref 0–40)
Albumin/Globulin Ratio: 1.9 (ref 1.2–2.2)
Albumin: 4.5 g/dL (ref 3.8–4.9)
Alkaline Phosphatase: 58 IU/L (ref 39–117)
BUN/Creatinine Ratio: 5 — ABNORMAL LOW (ref 9–20)
BUN: 5 mg/dL — ABNORMAL LOW (ref 6–24)
Bilirubin Total: 0.7 mg/dL (ref 0.0–1.2)
CO2: 22 mmol/L (ref 20–29)
Calcium: 9.7 mg/dL (ref 8.7–10.2)
Chloride: 105 mmol/L (ref 96–106)
Creatinine, Ser: 1 mg/dL (ref 0.76–1.27)
GFR calc Af Amer: 95 mL/min/{1.73_m2} (ref >59)
GFR calc non Af Amer: 82 mL/min/{1.73_m2} (ref >59)
Globulin, Total: 2.4 g/dL (ref 1.5–4.5)
Glucose: 89 mg/dL (ref 65–99)
Potassium: 4.2 mmol/L (ref 3.5–5.2)
Sodium: 144 mmol/L (ref 134–144)
Total Protein: 6.9 g/dL (ref 6.0–8.5)

## 2019-04-14 LAB — LIPID PANEL
Chol/HDL Ratio: 2.3 ratio (ref 0.0–5.0)
Cholesterol, Total: 129 mg/dL (ref 100–199)
HDL: 55 mg/dL (ref >39)
LDL Chol Calc (NIH): 61 mg/dL (ref 0–99)
Triglycerides: 58 mg/dL (ref 0–149)
VLDL Cholesterol Cal: 13 mg/dL (ref 5–40)

## 2019-04-14 LAB — FOLATE: Folate: 7.4 ng/mL (ref >3.0)

## 2019-04-14 LAB — VITAMIN B12: Vitamin B-12: 813 pg/mL (ref 232–1245)

## 2019-06-23 ENCOUNTER — Other Ambulatory Visit: Payer: Self-pay | Admitting: Family Medicine

## 2019-09-23 ENCOUNTER — Ambulatory Visit: Payer: Medicare HMO | Admitting: Student in an Organized Health Care Education/Training Program

## 2019-10-07 ENCOUNTER — Telehealth: Payer: Self-pay

## 2019-10-07 NOTE — Telephone Encounter (Signed)
Patient calls nurse line to check on status of [employer] [place] paperwork. Patient reports recently mailing paperwork to our office. Envelopes from patient have been placed in provider box for completion.   To PCP  Vincent Prude, RN

## 2019-10-08 NOTE — Telephone Encounter (Signed)
I have filled out my portion. There is still a section on part B that needs to be signed by patient and he will need to complete the questions about his ability.  I will place forms in file up front

## 2019-10-12 NOTE — Telephone Encounter (Signed)
Since the forms of are for transportation, I will have to mail them to his home. I have placed them in the mail.

## 2019-10-29 NOTE — Telephone Encounter (Signed)
Received mailed portion of SCAT form in the mail today. Faxed to [place] SCAT program. Copy made and placed in batch scanning.   Veronda Prude, RN

## 2019-12-17 ENCOUNTER — Other Ambulatory Visit: Payer: Self-pay

## 2019-12-17 ENCOUNTER — Ambulatory Visit (INDEPENDENT_AMBULATORY_CARE_PROVIDER_SITE_OTHER): Payer: Medicare HMO | Admitting: Student in an Organized Health Care Education/Training Program

## 2019-12-17 VITALS — BP 125/75 | HR 88 | Ht 68.0 in

## 2019-12-17 DIAGNOSIS — I69354 Hemiplegia and hemiparesis following cerebral infarction affecting left non-dominant side: Secondary | ICD-10-CM | POA: Diagnosis not present

## 2019-12-17 DIAGNOSIS — J302 Other seasonal allergic rhinitis: Secondary | ICD-10-CM

## 2019-12-17 DIAGNOSIS — Z720 Tobacco use: Secondary | ICD-10-CM

## 2019-12-17 MED ORDER — FLUTICASONE PROPIONATE 50 MCG/ACT NA SUSP: 2.0000 | Freq: Every day | NASAL | 1 refills | Status: DC

## 2019-12-17 MED ORDER — GUAIFENESIN ER 600 MG PO TB12
600.0000 mg | ORAL_TABLET | Freq: Two times a day (BID) | ORAL | 1 refills | Status: DC
Start: 2019-12-17 — End: 2020-01-03

## 2019-12-17 NOTE — Assessment & Plan Note (Signed)
Not currently active - refilled flonase, prescribed mucinex prn - recommended incentive spirometer

## 2019-12-17 NOTE — Assessment & Plan Note (Signed)
Interested in cutting back. About 35py. No documented lung cancer screenings. Patient agreed to get LD lung CT which was ordered today.

## 2019-12-17 NOTE — Progress Notes (Signed)
° °  SUBJECTIVE:   CHIEF COMPLAINT / HPI: physical No concerns today.  Fully COVID vaccinated since 8/21. Does not want to get booster when eligible due to his reaction to the initial vaccines.   Medications reviewed-   Takes ASA 325mg  EOD but is switching to 81mg  daily next week. No longer takes atorvastatin and now takes a garlic supplement. Intermittently takes flonase but has not needed for while. Denies current symptoms but would like a refill for future use.  Health maintenance- up to date. Latest labs 3/21 were all normal. Plan to repeat at next appointment in March/April timeframe.  Tobacco use- current 1 ppd. Started at age 59. No breathing problems. Chronic dry cough, no blood. Uses nicotine patches occasionally. Trying to cut back on smoking by praying but things are hectic around holidays so will wait until after. Coughing gets worse when he's hot and wearing a mask.  L-sided Hemiplegia s/p stroke >60years ago. Occasional sacral pressure ulcers. Uses diaper rash cream for pressure sores. Aid takes care of them after showers every other day.  Lives alone unless son staying with him- Most weekends. Aid comes four days per week.   For fun: Listens to radio, plays with chihuahua Jo-Jo, calls friends/family. Never gets bored.   OBJECTIVE:   BP 125/75    Pulse 88    Ht 5\' 8"  (1.727 m)    SpO2 95%    BMI 25.09 kg/m   General: NAD, pleasant, able to participate in exam Cardiac: RRR, normal heart sounds, no murmurs. 2+ radial and PT pulses bilaterally Respiratory: CTAB, normal effort, No wheezes. Abdomen: soft, nontender, nondistended, no hepatic or splenomegaly, +BS Extremities: no edema. WWP. Skin: warm and dry, no rashes noted. Neg sacral ulcer.  Neuro: alert and oriented. RUE 4/5 strength, RLE 3/5 strength, LUE 2/5 strength, LLE 1/5.  Psych: Normal affect and mood  ASSESSMENT/PLAN:   Hemiparesis affecting left side as late effect of stroke (HCC) Stable. Would benefit from  home rehab exercises to strengthen his active side. Discuss at next appointment and provide with resistance band Consider formal PT  Tobacco abuse Interested in cutting back. About 60py. No documented lung cancer screenings. Patient agreed to get LD lung CT which was ordered today.   Seasonal allergies Not currently active - refilled flonase, prescribed mucinex prn - recommended incentive spirometer   Incentive spirometer, mucinex, flonase  4/21, DO [facility]

## 2019-12-17 NOTE — Patient Instructions (Signed)
It was a pleasure to see you today!  To summarize our discussion for this visit:  Please look for an incentive spirometer at your pharmacy, you can also use mucinex and flonase (which I sent to your pharmacy) to help with sinus drainage.   I have ordered a lung CT to screen for cancer with your long history of smoking. I'm glad you're trying to cut back.  Come back in April and we will check some basic labs  Let me know if you need anything else in the meantime.    Call the clinic at [phone] if your symptoms worsen or you have any concerns.   Thank you for allowing me to take part in your care,  Dr. Jamelle Rushing

## 2019-12-17 NOTE — Assessment & Plan Note (Addendum)
Stable. Would benefit from home rehab exercises to strengthen his active side. Discuss at next appointment and provide with resistance band Consider formal PT

## 2020-01-03 ENCOUNTER — Telehealth: Payer: Self-pay

## 2020-01-03 MED ORDER — FLUTICASONE PROPIONATE 50 MCG/ACT NA SUSP: 2.0000 | Freq: Every day | NASAL | 1 refills | Status: DC

## 2020-01-03 MED ORDER — GUAIFENESIN ER 600 MG PO TB12: 600.0000 mg | ORAL_TABLET | Freq: Two times a day (BID) | ORAL | 1 refills | Status: DC

## 2020-01-03 NOTE — Telephone Encounter (Signed)
Patient calls nurse line reporting he never received his 11/19 prescriptions. Patient advised they were sent in at visit to local pharmacy. Patient reports he needed them to go to [facility] mail order due to transportation. I verified with [facility] that meds were never picked up. Meds sent to [facility].

## 2020-01-12 ENCOUNTER — Ambulatory Visit
Admission: RE | Admit: 2020-01-12 | Discharge: 2020-01-12 | Disposition: A | Discharge status: Home / Self Care | Payer: Medicare HMO | Source: Ambulatory Visit | Attending: Family Medicine | Admitting: Family Medicine

## 2020-01-12 ENCOUNTER — Other Ambulatory Visit: Payer: Self-pay

## 2020-01-12 DIAGNOSIS — I2510 Atherosclerotic heart disease of native coronary artery without angina pectoris: Secondary | ICD-10-CM | POA: Diagnosis not present

## 2020-01-12 DIAGNOSIS — F17210 Nicotine dependence, cigarettes, uncomplicated: Secondary | ICD-10-CM | POA: Diagnosis not present

## 2020-01-12 DIAGNOSIS — J432 Centrilobular emphysema: Secondary | ICD-10-CM | POA: Diagnosis not present

## 2020-01-12 DIAGNOSIS — Z720 Tobacco use: Secondary | ICD-10-CM

## 2020-01-12 DIAGNOSIS — J984 Other disorders of lung: Secondary | ICD-10-CM | POA: Diagnosis not present

## 2020-01-13 DIAGNOSIS — I69354 Hemiplegia and hemiparesis following cerebral infarction affecting left non-dominant side: Secondary | ICD-10-CM | POA: Diagnosis not present

## 2020-01-13 DIAGNOSIS — I6789 Other cerebrovascular disease: Secondary | ICD-10-CM | POA: Diagnosis not present

## 2020-01-13 DIAGNOSIS — G8194 Hemiplegia, unspecified affecting left nondominant side: Secondary | ICD-10-CM | POA: Diagnosis not present

## 2020-05-03 ENCOUNTER — Other Ambulatory Visit: Payer: Self-pay

## 2020-05-03 MED ORDER — KETOCONAZOLE 2 % EX CREA: 1.0000 "application " | TOPICAL_CREAM | Freq: Every day | CUTANEOUS | 2 refills | Status: DC

## 2020-05-08 ENCOUNTER — Other Ambulatory Visit: Payer: Self-pay

## 2020-05-08 NOTE — Telephone Encounter (Signed)
Patient calls nurse line requesting a refill on Bupropion. Patient reports he has not had this medication in a while, however he is trying to quit smoking. Patient does have a scheduled upcoming apt with PCP on 5/5. Please advise.

## 2020-05-09 NOTE — Telephone Encounter (Signed)
Patient returns call to nurse line to check status of rx refill. Informed patient that rx refill was denied as he needs an appointment. Patient became upset, stating "I desperately want to quit smoking."  Patient has follow up appointment on 5/5. Patient requesting partial refill to last until appointment on 5/5.  Please advise.   Veronda Prude, RN

## 2020-05-15 NOTE — Telephone Encounter (Signed)
Pt calling to check the status of a partial refill.  Jone Baseman, CMA

## 2020-05-17 DIAGNOSIS — R350 Frequency of micturition: Secondary | ICD-10-CM | POA: Diagnosis not present

## 2020-05-17 DIAGNOSIS — R3915 Urgency of urination: Secondary | ICD-10-CM | POA: Diagnosis not present

## 2020-05-17 DIAGNOSIS — N401 Enlarged prostate with lower urinary tract symptoms: Secondary | ICD-10-CM | POA: Diagnosis not present

## 2020-05-17 DIAGNOSIS — R972 Elevated prostate specific antigen [PSA]: Secondary | ICD-10-CM | POA: Diagnosis not present

## 2020-05-24 ENCOUNTER — Ambulatory Visit (INDEPENDENT_AMBULATORY_CARE_PROVIDER_SITE_OTHER): Payer: Medicare HMO | Admitting: Student in an Organized Health Care Education/Training Program

## 2020-05-24 ENCOUNTER — Other Ambulatory Visit: Payer: Self-pay

## 2020-05-24 ENCOUNTER — Encounter: Payer: Self-pay | Admitting: Student in an Organized Health Care Education/Training Program

## 2020-05-24 VITALS — BP 138/76 | HR 72 | Ht 68.0 in

## 2020-05-24 DIAGNOSIS — Z7289 Other problems related to lifestyle: Secondary | ICD-10-CM

## 2020-05-24 DIAGNOSIS — Z720 Tobacco use: Secondary | ICD-10-CM | POA: Diagnosis not present

## 2020-05-24 DIAGNOSIS — Z1211 Encounter for screening for malignant neoplasm of colon: Secondary | ICD-10-CM | POA: Diagnosis not present

## 2020-05-24 DIAGNOSIS — Z0000 Encounter for general adult medical examination without abnormal findings: Secondary | ICD-10-CM

## 2020-05-24 DIAGNOSIS — E785 Hyperlipidemia, unspecified: Secondary | ICD-10-CM

## 2020-05-24 DIAGNOSIS — I69354 Hemiplegia and hemiparesis following cerebral infarction affecting left non-dominant side: Secondary | ICD-10-CM | POA: Diagnosis not present

## 2020-05-24 DIAGNOSIS — R739 Hyperglycemia, unspecified: Secondary | ICD-10-CM | POA: Diagnosis not present

## 2020-05-24 DIAGNOSIS — Z789 Other specified health status: Secondary | ICD-10-CM

## 2020-05-24 MED ORDER — BUPROPION HCL ER (SR) 150 MG PO TB12: 150.0000 mg | ORAL_TABLET | Freq: Two times a day (BID) | ORAL | 0 refills | Status: DC

## 2020-05-24 NOTE — Assessment & Plan Note (Addendum)
Recommended PT. Patient declined at this time but says he is open to discussing once he has stopped smoking. Recommended patient decrease/discontinue OTC NSAID use, specifically BC powder Continue on 81mg  ASA daily and atorvastatin. Repeat lipid panel today

## 2020-05-24 NOTE — Patient Instructions (Signed)
It was a pleasure to see you today!  To summarize our discussion for this visit:  I'm so glad that you are taking steps to stop smoking! I will send in your prescription today.   Please limit the amount of over the counter NSAIDs you take such as BC powders to only as needed a couple times a month. You can use tylenol up to 3g per day.  Your daily aspirin dose should stick to 81mg   I'll let you know if there are any changes needed based on your labs today  Please see me in 2 months to check in and we can discuss physical therapy at that time  GI will reach out to you to schedule a colonoscopy  Some additional health maintenance measures we should update are: Health Maintenance Due  Topic Date Due  . COLONOSCOPY (Pts 45-37yrs Insurance coverage will need to be confirmed)  01/29/2020  . COVID-19 Vaccine (3 - Booster for Moderna series) 03/27/2020  .    Please return to our clinic to see me 2 months.  Call the clinic at [phone] if your symptoms worsen or you have any concerns.   Thank you for allowing me to take part in your care,  Dr. [phone]

## 2020-05-24 NOTE — Progress Notes (Signed)
   SUBJECTIVE:   CHIEF COMPLAINT / HPI: tobacco use  Tobacco use- 1ppd started when 60yo. Lung cancer screening 12/21 was neg Used bupropion 150mg  before. Interested in starting again after recently losing his mother.   Alcohol use- 6 pack beer over the weekend. 1-2 per weekdays to help him sleep.  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 Was able to ambulate at one time but has been many years and has very little function on that size.  Patient has been taking 2 of his 81mg  ASAs per day. Denies bleeding.  Healthcare maintenance: Colonoscopy- last one 02/19/2011. Almost due for repeat.   OBJECTIVE:   BP 138/76   Pulse 72   Ht 5\' 8"  (1.727 m)   SpO2 96%   BMI 25.09 kg/m   Physical Exam Vitals and nursing note reviewed.  Constitutional:      General: He is not in acute distress.    Appearance: He is normal weight. He is not toxic-appearing.  Cardiovascular:     Rate and Rhythm: Normal rate and regular rhythm.     Pulses: Normal pulses.     Heart sounds: No murmur heard.   Pulmonary:     Effort: Pulmonary effort is normal.     Breath sounds: No decreased air movement. Rhonchi (generalized and improved with cough) present.  Musculoskeletal:        General: No swelling.  Skin:    General: Skin is warm.     Capillary Refill: Capillary refill takes less than 2 seconds.  Neurological:     Mental Status: He is alert and oriented to person, place, and time. Mental status is at baseline.     GCS: GCS eye subscore is 4. GCS verbal subscore is 5. GCS motor subscore is 6.     Cranial Nerves: No facial asymmetry.     Sensory: Sensation is intact.     Motor: Weakness (left extremities have no active motion. R extremity strength limited 4/5) and atrophy (left extremities) present.  Psychiatric:        Mood and Affect: Mood normal.        Behavior: Behavior normal.    ASSESSMENT/PLAN:   Hemiparesis affecting left side as late effect of stroke (HCC) Recommended  PT. Patient declined at this time but says he is open to discussing once he has stopped smoking. Recommended patient decrease/discontinue OTC NSAID use, specifically BC powder Continue on 81mg  ASA daily and atorvastatin. Repeat lipid panel today  Tobacco abuse Patient desires to stop smoking.  He has used wellbutrin in the past successfully and would like to try again.  Used 150mg  in the past and would like to start at this dose again. Discussed possible side effects and patient is sure he wants to start again at this time and at the 150mg  dose.  He has [employer] resources already Lung cancer screen UTD F/u 8 weeks  Alcohol use Beer- 6pack/weekend, 1-2/week night. After he quits smoking, he desires to work on decreasing his drinking CMP today  Healthcare maintenance GI referral for colonoscopy f/u placed today     , DO [facility]

## 2020-05-24 NOTE — Assessment & Plan Note (Addendum)
Patient desires to stop smoking.  He has used wellbutrin in the past successfully and would like to try again.  Used 150mg  in the past and would like to start at this dose again. Discussed possible side effects and patient is sure he wants to start again at this time and at the 150mg  dose.  He has [employer] resources already Lung cancer screen UTD F/u 8 weeks

## 2020-05-24 NOTE — Assessment & Plan Note (Addendum)
Beer- 6pack/weekend, 1-2/week night. After he quits smoking, he desires to work on decreasing his drinking CMP today

## 2020-05-24 NOTE — Assessment & Plan Note (Signed)
GI referral for colonoscopy f/u placed today

## 2020-05-25 LAB — CBC
Hematocrit: 52 % — ABNORMAL HIGH (ref 37.5–51.0)
Hemoglobin: 17.3 g/dL (ref 13.0–17.7)
MCH: 29.5 pg (ref 26.6–33.0)
MCHC: 33.3 g/dL (ref 31.5–35.7)
MCV: 89 fL (ref 79–97)
Platelets: 150 10*3/uL (ref 150–450)
RBC: 5.87 x10E6/uL — ABNORMAL HIGH (ref 4.14–5.80)
RDW: 12.9 % (ref 11.6–15.4)
WBC: 3.3 10*3/uL — ABNORMAL LOW (ref 3.4–10.8)

## 2020-05-25 LAB — COMPREHENSIVE METABOLIC PANEL
ALT: 20 IU/L (ref 0–44)
AST: 19 IU/L (ref 0–40)
Albumin/Globulin Ratio: 1.8 (ref 1.2–2.2)
Albumin: 4.3 g/dL (ref 3.8–4.9)
Alkaline Phosphatase: 48 IU/L (ref 44–121)
BUN/Creatinine Ratio: 8 — ABNORMAL LOW (ref 10–24)
BUN: 7 mg/dL — ABNORMAL LOW (ref 8–27)
Bilirubin Total: 0.6 mg/dL (ref 0.0–1.2)
CO2: 21 mmol/L (ref 20–29)
Calcium: 9.3 mg/dL (ref 8.6–10.2)
Chloride: 101 mmol/L (ref 96–106)
Creatinine, Ser: 0.86 mg/dL (ref 0.76–1.27)
Globulin, Total: 2.4 g/dL (ref 1.5–4.5)
Glucose: 79 mg/dL (ref 65–99)
Potassium: 4.6 mmol/L (ref 3.5–5.2)
Sodium: 139 mmol/L (ref 134–144)
Total Protein: 6.7 g/dL (ref 6.0–8.5)
eGFR: 99 mL/min/{1.73_m2} (ref >59)

## 2020-05-25 LAB — LIPID PANEL
Chol/HDL Ratio: 2.6 ratio (ref 0.0–5.0)
Cholesterol, Total: 120 mg/dL (ref 100–199)
HDL: 47 mg/dL (ref >39)
LDL Chol Calc (NIH): 61 mg/dL (ref 0–99)
Triglycerides: 51 mg/dL (ref 0–149)
VLDL Cholesterol Cal: 12 mg/dL (ref 5–40)

## 2020-06-01 ENCOUNTER — Ambulatory Visit: Payer: Medicare HMO | Admitting: Student in an Organized Health Care Education/Training Program

## 2020-07-17 ENCOUNTER — Ambulatory Visit: Payer: Medicare HMO | Admitting: Student in an Organized Health Care Education/Training Program

## 2020-07-19 ENCOUNTER — Other Ambulatory Visit: Payer: Self-pay

## 2020-07-19 ENCOUNTER — Encounter: Payer: Self-pay | Admitting: Student in an Organized Health Care Education/Training Program

## 2020-07-19 ENCOUNTER — Ambulatory Visit (INDEPENDENT_AMBULATORY_CARE_PROVIDER_SITE_OTHER): Payer: Medicare HMO | Admitting: Student in an Organized Health Care Education/Training Program

## 2020-07-19 DIAGNOSIS — Z7289 Other problems related to lifestyle: Secondary | ICD-10-CM | POA: Diagnosis not present

## 2020-07-19 DIAGNOSIS — Z720 Tobacco use: Secondary | ICD-10-CM | POA: Diagnosis not present

## 2020-07-19 DIAGNOSIS — Z789 Other specified health status: Secondary | ICD-10-CM

## 2020-07-19 DIAGNOSIS — I69354 Hemiplegia and hemiparesis following cerebral infarction affecting left non-dominant side: Secondary | ICD-10-CM | POA: Diagnosis not present

## 2020-07-19 NOTE — Patient Instructions (Signed)
It was a pleasure to see you today!  To summarize our discussion for this visit: I'm glad to hear that you have been cutting back on beer and are still working on a plan to cut back on smoking.  Since you may have had a reaction to the new medication (bupropion) I would recommend stopping it but if you really want to keep taking it, please take it when someone is available to be with you and care for you if something were to happen. If you notice side effects again, please stop taking this medicine.   Some additional health maintenance measures we should update are: Health Maintenance Due  Topic Date Due   Pneumococcal Vaccine 30-47 Years old (1 - PCV) Never done   Zoster Vaccines- Shingrix (1 of 2) Never done   COLONOSCOPY (Pts 45-72yrs Insurance coverage will need to be confirmed)  01/29/2020   COVID-19 Vaccine (3 - Booster for Moderna series) 02/27/2020     Please return to our clinic to see me in about 3 months.  Call the clinic at [phone] if your symptoms worsen or you have any concerns.   Thank you for allowing me to take part in your care,  Dr. Jamelle Rushing

## 2020-07-19 NOTE — Progress Notes (Signed)
    SUBJECTIVE:   CHIEF COMPLAINT / HPI: follow up CVA, tobacco use, alcohol use  Tobacco- tried using Wellbutrin once but felt that it caused him to have muscle spasm of his left side. Although he does have these spasms frequently since his stroke. He called EMS to come help him out of his recliner and to the bathroom. He then smoked a cigarette and got better. Took no more Wellbutrin. He plans to start taking it again as he still desires to quit smoking but wanted to wait to talk with me about it first.  Takes Mucinex for phlegm which helps.   Alcohol- 1-2 beers per day. Decreased from previous.   ASA with breakfast.   Garlic on food to treat cholesterol. Not taking cholesterol medication.   OBJECTIVE:   BP (!) 153/77   Pulse 79   SpO2 94%   Physical Exam Vitals and nursing note reviewed.  Constitutional:      General: He is not in acute distress.    Appearance: He is not ill-appearing or toxic-appearing.  Neck:     Thyroid: No thyroid mass or thyromegaly.  Cardiovascular:     Rate and Rhythm: Normal rate and regular rhythm.  Pulmonary:     Effort: Pulmonary effort is normal. No respiratory distress.     Breath sounds: Normal breath sounds.  Musculoskeletal:     Right lower leg: No edema.     Left lower leg: No edema.  Lymphadenopathy:     Cervical: No cervical adenopathy.  Skin:    General: Skin is warm and dry.     Capillary Refill: Capillary refill takes less than 2 seconds.  Neurological:     Comments: Chronic left hemiparesis     ASSESSMENT/PLAN:   Tobacco abuse Recommended patient not continue wellbutrin as he feels he had a negative reaction to the medication but he is determined to use it still.  I asked him to take it in the presence of someone who can care for him if he has repeat side effects and he agreed.  His primary plan for cessation is praying. Will follow up on next appointment  Alcohol use Continues to decrease use. Endorses 1-2 beers per day  now  Hemiparesis affecting left side as late effect of stroke (HCC) Cholesterol well controlled.  Patient prefers to treat with garlic. Have discussed benefits of statin therapy with patient      Leeroy Bock, DO [facility]

## 2020-07-21 DIAGNOSIS — G8194 Hemiplegia, unspecified affecting left nondominant side: Secondary | ICD-10-CM | POA: Diagnosis not present

## 2020-07-21 DIAGNOSIS — I6789 Other cerebrovascular disease: Secondary | ICD-10-CM | POA: Diagnosis not present

## 2020-07-21 DIAGNOSIS — I69354 Hemiplegia and hemiparesis following cerebral infarction affecting left non-dominant side: Secondary | ICD-10-CM | POA: Diagnosis not present

## 2020-07-24 NOTE — Assessment & Plan Note (Signed)
Continues to decrease use. Endorses 1-2 beers per day now

## 2020-07-24 NOTE — Assessment & Plan Note (Addendum)
Recommended patient not continue wellbutrin as he feels he had a negative reaction to the medication but he is determined to use it still.  I asked him to take it in the presence of someone who can care for him if he has repeat side effects and he agreed.  His primary plan for cessation is praying. Will follow up on next appointment

## 2020-07-24 NOTE — Assessment & Plan Note (Signed)
Cholesterol well controlled.  Patient prefers to treat with garlic. Have discussed benefits of statin therapy with patient

## 2020-08-09 ENCOUNTER — Other Ambulatory Visit: Payer: Self-pay | Admitting: Family Medicine

## 2020-08-15 DIAGNOSIS — I6789 Other cerebrovascular disease: Secondary | ICD-10-CM | POA: Diagnosis not present

## 2020-08-15 DIAGNOSIS — G8194 Hemiplegia, unspecified affecting left nondominant side: Secondary | ICD-10-CM | POA: Diagnosis not present

## 2020-08-15 DIAGNOSIS — I69354 Hemiplegia and hemiparesis following cerebral infarction affecting left non-dominant side: Secondary | ICD-10-CM | POA: Diagnosis not present

## 2020-10-09 ENCOUNTER — Other Ambulatory Visit: Payer: Self-pay

## 2020-10-09 MED ORDER — GUAIFENESIN ER 600 MG PO TB12: 600.0000 mg | ORAL_TABLET | Freq: Two times a day (BID) | ORAL | 1 refills | Status: AC

## 2020-10-14 ENCOUNTER — Ambulatory Visit (INDEPENDENT_AMBULATORY_CARE_PROVIDER_SITE_OTHER): Payer: Medicare HMO

## 2020-10-14 ENCOUNTER — Telehealth: Payer: Self-pay

## 2020-10-14 DIAGNOSIS — Z0000 Encounter for general adult medical examination without abnormal findings: Secondary | ICD-10-CM

## 2020-10-14 NOTE — Telephone Encounter (Signed)
Pt is also interested in setting up HCPOA and needs assistance completing.    Sent letter to [facility] regarding documentation of last colonoscopy and informed patient is due again for follow up colonoscopy.

## 2020-10-14 NOTE — Progress Notes (Signed)
Subjective:   Vincent Byrd is a 60 y.o. male who presents for Medicare Annual/Subsequent preventive examination.  I connected with  Vincent Byrd on 10/14/20 by an audio only telemedicine application and verified that I am speaking with the correct person using two identifiers.   I discussed the limitations, risks, security and privacy concerns of performing an evaluation and management service by telephone and the availability of in person appointments. I also discussed with the patient that there may be a patient responsible charge related to this service. The patient expressed understanding and verbally consented to this telephonic visit.  Location of Patient: Home Location of Provider: Office  List any persons and their role that are participating in the visit with the patient. No other persons assisted with visit.   Non face to face encounter, 45 min.    Review of Systems     Defer to PCP Cardiac Risk Factors include: smoking/ tobacco exposure;dyslipidemia;advanced age (>60men, >58 women);sedentary lifestyle     Objective:    There were no vitals filed for this visit. There is no height or weight on file to calculate BMI.  Advanced Directives 10/14/2020 07/19/2020 03/10/2019 12/01/2017 11/21/2017 03/13/2017 11/29/2016  Does Patient Have a Medical Advance Directive? No No No No No No No  Would patient like information on creating a medical advance directive? Yes (MAU/Ambulatory/Procedural Areas - Information given) No - Patient declined No - Patient declined No - Patient declined No - Patient declined No - Patient declined No - Patient declined    Current Medications (verified) Outpatient Encounter Medications as of 10/14/2020  Medication Sig   guaiFENesin (MUCINEX) 600 MG 12 hr tablet Take 1 tablet (600 mg total) by mouth 2 (two) times daily.   nicotine (NICODERM CQ - DOSED IN MG/24 HOURS) 14 mg/24hr patch Place 1 patch (14 mg total) onto the skin daily.   aspirin 325 MG  tablet Take 325 mg by mouth every other day.  (Patient not taking: No sig reported)   atorvastatin (LIPITOR) 40 MG tablet TAKE 1 TABLET EVERY DAY (Patient not taking: No sig reported)   baclofen (LIORESAL) 10 MG tablet TAKE 1/2 TABLET (5 MG TOTAL) BY MOUTH 3 (THREE) TIMES DAILY AS NEEDED FOR MUSCLE SPASMS. (Patient not taking: Reported on 12/17/2019)   buPROPion (WELLBUTRIN SR) 150 MG 12 hr tablet Take 1 tablet (150 mg total) by mouth 2 (two) times daily. (Patient not taking: Reported on 10/14/2020)   fluticasone (FLONASE) 50 MCG/ACT nasal spray Place 2 sprays into both nostrils daily. (Patient not taking: Reported on 10/14/2020)   ketoconazole (NIZORAL) 2 % cream Apply 1 application topically daily. (Patient not taking: Reported on 10/14/2020)   Vitamin D, Ergocalciferol, (DRISDOL) 1.25 MG (50000 UT) CAPS capsule TAKE 1 CAPSULE EVERY 7 DAYS (Patient not taking: Reported on 10/14/2020)   zinc oxide (MEIJER ZINC OXIDE) 20 % ointment Apply 1 application topically as needed for irritation. (Patient not taking: No sig reported)   No facility-administered encounter medications on file as of 10/14/2020.    Allergies (verified) Amoxicillin and Ampicillin   History: Past Medical History:  Diagnosis Date   Hemiparesis (HCC)    affecting left side, secondary to stroke in 1997   Stroke [facility])    Past Surgical History:  Procedure Laterality Date   APPENDECTOMY     age 38   TONSILLECTOMY     Family History  Problem Relation Age of Onset   Diabetes Mother    Breast cancer Mother  Colon cancer Brother 42   Social History   Socioeconomic History   Marital status: Divorced    Spouse name: Not on file   Number of children: 2   Years of education: some college   Highest education level: Some college, no degree  Occupational History   Occupation: disabled    Comment: tobacco Set designer  Tobacco Use   Smoking status: Every Day    Packs/day: 1.00    Years: 35.00    Pack years: 35.00     Types: Cigarettes   Smokeless tobacco: Never   Tobacco comments:    wants to discuss further with PCP  Vaping Use   Vaping Use: Never used  Substance and Sexual Activity   Alcohol use: Yes    Alcohol/week: 6.0 standard drinks    Types: 6 Cans of beer per week    Comment: 6 on weekend   Drug use: No   Sexual activity: Not Currently  Other Topics Concern   Not on file  Social History Narrative   Lives with son. Lives in house, one level, has step in shower, has a ramp for wheelchair. Has grab bars in restroom. Smoke alarms.      Has medical alert button and a home health aide 4 days a week for 2 hours. Would like to have HH aide 7 days a week.      Has a modified [name] he drives and uses [employer].      Has a chihuahua named JoJo.       Likes to sit on porch, likes to listen to radio.      Likes to eat mostly meats, wants to add in vegetables, eats some fruits, has a sweet tooth. Likes ginger ale and beer.      Wears seat belt in vehicle.    Social Determinants of Health   Financial Resource Strain: Low Risk    Difficulty of Paying Living Expenses: Not hard at all  Food Insecurity: Not on file  Transportation Needs: No Transportation Needs   Lack of Transportation (Medical): No   Lack of Transportation (Non-Medical): No  Physical Activity: Inactive   Days of Exercise per Week: 0 days   Minutes of Exercise per Session: 0 min  Stress: No Stress Concern Present   Feeling of Stress : Not at all  Social Connections: Socially Isolated   Frequency of Communication with Friends and Family: Once a week   Frequency of Social Gatherings with Friends and Family: Once a week   Attends Religious Services: Never   Database administrator or Organizations: No   Attends Engineer, structural: Never   Marital Status: Divorced    Tobacco Counseling Ready to quit: Yes Counseling given: No Tobacco comments: wants to discuss further with PCP   Clinical Intake:  Pre-visit  preparation completed: Yes  Pain : No/denies pain     Nutritional Risks: None Diabetes: No  How often do you need to have someone help you when you read instructions, pamphlets, or other written materials from your doctor or pharmacy?: 1 - Never What is the last grade level you completed in school?: 12th grade  Diabetic? No  Interpreter Needed?: No  Information entered by :: Radford Pax, CMA   Activities of Daily Living In your present state of health, do you have any difficulty performing the following activities: 10/14/2020  Hearing? N  Vision? N  Difficulty concentrating or making decisions? N  Walking or climbing stairs? Y  Comment  Patient has assistance  Dressing or bathing? Y  Comment Patient has assistance  Doing errands, shopping? Y  Comment Patient has Network engineer and eating ? N  Using the Toilet? N  In the past six months, have you accidently leaked urine? Y  Do you have problems with loss of bowel control? N  Managing your Medications? N  Managing your Finances? N  Housekeeping or managing your Housekeeping? Y  Comment Patient has assistance  Some recent data might be hidden    Patient Care Team: Sabino Dick, DO as PCP - General (Family Medicine)  Indicate any recent Medical Services you may have received from other than [facility] providers in the past year (date may be approximate).     Assessment:   This is a routine wellness examination for Marcoantonio.  Hearing/Vision screen No results found.  Dietary issues and exercise activities discussed: Current Exercise Habits: The patient does not participate in regular exercise at present, Exercise limited by: neurologic condition(s);respiratory conditions(s)   Goals Addressed   None   Depression Screen PHQ 2/9 Scores 10/14/2020 07/19/2020 05/24/2020 12/17/2019 04/13/2019 03/10/2019 12/01/2017  PHQ - 2 Score 0 2 3 2  0 0 0  PHQ- 9 Score - 2 6 2  - - -    Fall Risk Fall Risk   10/14/2020 07/19/2020 04/13/2019 03/10/2019 12/01/2017  Falls in the past year? 0 0 0 0 0  Number falls in past yr: 0 0 0 0 -  Injury with Fall? 0 0 - - -  Risk for fall due to : Impaired balance/gait;Impaired mobility - - - -  Follow up Falls evaluation completed - Falls evaluation completed - -    FALL RISK PREVENTION PERTAINING TO THE HOME:  Any stairs in or around the home? Yes  If so, are there any without handrails? No  Home free of loose throw rugs in walkways, pet beds, electrical cords, etc? No  Adequate lighting in your home to reduce risk of falls? Yes   ASSISTIVE DEVICES UTILIZED TO PREVENT FALLS:  Life alert? Yes  Use of a cane, walker or w/c? Yes  Grab bars in the bathroom? Yes  Shower chair or bench in shower? Yes  Elevated toilet seat or a handicapped toilet? No    Cognitive Function: MMSE - Mini Mental State Exam 11/21/2017  Orientation to time 5  Orientation to Place 5  Registration 3  Attention/ Calculation 5  Recall 3  Language- name 2 objects 2  Language- repeat 1  Language- follow 3 step command 3  Language- read & follow direction 1  Write a sentence 1  Copy design 1  Total score 30     6CIT Screen 10/14/2020 11/21/2017  What Year? 0 points 0 points  What month? 0 points 0 points  What time? 0 points 0 points  Count back from 20 0 points 0 points  Months in reverse 0 points 0 points  Repeat phrase 0 points 0 points  Total Score 0 0    Immunizations Immunization History  Administered Date(s) Administered   Moderna Sars-Covid-2 Vaccination 05/25/2019, 09/27/2019   Tdap 11/04/2014    TDAP status: Up to date  Flu Vaccine status: Declined, Education has been provided regarding the importance of this vaccine but patient still declined. Advised may receive this vaccine at local pharmacy or Health Dept. Aware to provide a copy of the vaccination record if obtained from local pharmacy or Health Dept. Verbalized acceptance and  understanding.  Pneumococcal vaccine status: Due,  Education has been provided regarding the importance of this vaccine. Advised may receive this vaccine at local pharmacy or Health Dept. Aware to provide a copy of the vaccination record if obtained from local pharmacy or Health Dept. Verbalized acceptance and understanding.  Covid-19 vaccine status: Information provided on how to obtain vaccines.   Qualifies for Shingles Vaccine? Yes   Zostavax completed No   Shingrix Completed?: No.    Education has been provided regarding the importance of this vaccine. Patient has been advised to call insurance company to determine out of pocket expense if they have not yet received this vaccine. Advised may also receive vaccine at local pharmacy or Health Dept. Verbalized acceptance and understanding.  Screening Tests Health Maintenance  Topic Date Due   Zoster Vaccines- Shingrix (1 of 2) Never done   COLONOSCOPY (Pts 45-90yrs Insurance coverage will need to be confirmed)  01/29/2020   COVID-19 Vaccine (3 - Booster for Moderna series) 02/27/2020   INFLUENZA VACCINE  Never done   TETANUS/TDAP  11/03/2024   Hepatitis C Screening  Completed   HIV Screening  Completed   HPV VACCINES  Aged Out    Health Maintenance  Health Maintenance Due  Topic Date Due   Zoster Vaccines- Shingrix (1 of 2) Never done   COLONOSCOPY (Pts 45-22yrs Insurance coverage will need to be confirmed)  01/29/2020   COVID-19 Vaccine (3 - Booster for Moderna series) 02/27/2020   INFLUENZA VACCINE  Never done    Colorectal cancer screening: Type of screening: Colonoscopy. Completed 01/28/2010. Repeat every 10 years  Lung Cancer Screening: (Low Dose CT Chest recommended if Age 61-80 years, 30 pack-year currently smoking OR have quit w/in 15years.) does qualify.   Lung Cancer Screening Referral: N/A  Additional Screening:  Hepatitis C Screening: does qualify; Completed 07/19/2015  Vision Screening: Recommended annual  ophthalmology exams for early detection of glaucoma and other disorders of the eye. Is the patient up to date with their annual eye exam?  No  Who is the provider or what is the name of the office in which the patient attends annual eye exams? Doesn't have one that he sees currently.  If pt is not established with a provider, would they like to be referred to a provider to establish care?  N/A .   Dental Screening: Recommended annual dental exams for proper oral hygiene  Community Resource Referral / Chronic Care Management: CRR required this visit?  No   CCM required this visit?  No      Plan:     I have personally reviewed and noted the following in the patient's chart:   Medical and social history Use of alcohol, tobacco or illicit drugs  Current medications and supplements including opioid prescriptions. Patient is not currently taking opioid prescriptions. Functional ability and status Nutritional status Physical activity Advanced directives List of other physicians Hospitalizations, surgeries, and ER visits in previous 12 months Vitals Screenings to include cognitive, depression, and falls Referrals and appointments  In addition, I have reviewed and discussed with patient certain preventive protocols, quality metrics, and best practice recommendations. A written personalized care plan for preventive services as well as general preventive health recommendations were provided to patient.     Dennis Bast, CMA   10/14/2020   Nurse Notes: Patient and I discussed ACP and the importance of this planning. Patient agrees to learn more about it and would like a visit with PCP to discuss the further and to determine the next steps to  complete this.  Sent letter to [facility] regarding documentation of last colonoscopy and informed patient is due again for follow up colonoscopy.    Mr. Kopf , Thank you for taking time to come for your Medicare Wellness  Visit. I appreciate your ongoing commitment to your health goals. Please review the following plan we discussed and let me know if I can assist you in the future.   These are the goals we discussed:  Goals      DIET - EAT MORE FRUITS AND VEGETABLES     Quit Smoking        This is a list of the screening recommended for you and due dates:  Health Maintenance  Topic Date Due   Zoster (Shingles) Vaccine (1 of 2) Never done   Colon Cancer Screening  01/29/2020   COVID-19 Vaccine (3 - Booster for Moderna series) 02/27/2020   Flu Shot  Never done   Tetanus Vaccine  11/03/2024   Hepatitis C Screening: USPSTF Recommendation to screen - Ages 18-79 yo.  Completed   HIV Screening  Completed   HPV Vaccine  Aged Out

## 2020-10-14 NOTE — Patient Instructions (Signed)
Health Maintenance, Male Adopting a healthy lifestyle and getting preventive care are important in promoting health and wellness. Ask your health care provider about: The right schedule for you to have regular tests and exams. Things you can do on your own to prevent diseases and keep yourself healthy. What should I know about diet, weight, and exercise? Eat a healthy diet  Eat a diet that includes plenty of vegetables, fruits, low-fat dairy products, and lean protein. Do not eat a lot of foods that are high in solid fats, added sugars, or sodium. Maintain a healthy weight Body mass index (BMI) is a measurement that can be used to identify possible weight problems. It estimates body fat based on height and weight. Your health care provider can help determine your BMI and help you achieve or maintain a healthy weight. Get regular exercise Get regular exercise. This is one of the most important things you can do for your health. Most adults should: Exercise for at least 150 minutes each week. The exercise should increase your heart rate and make you sweat (moderate-intensity exercise). Do strengthening exercises at least twice a week. This is in addition to the moderate-intensity exercise. Spend less time sitting. Even light physical activity can be beneficial. Watch cholesterol and blood lipids Have your blood tested for lipids and cholesterol at 61 years of age, then have this test every 5 years. You may need to have your cholesterol levels checked more often if: Your lipid or cholesterol levels are high. You are older than 61 years of age. You are at high risk for heart disease. What should I know about cancer screening? Many types of cancers can be detected early and may often be prevented. Depending on your health history and family history, you may need to have cancer screening at various ages. This may include screening for: Colorectal cancer. Prostate cancer. Skin cancer. Lung  cancer. What should I know about heart disease, diabetes, and high blood pressure? Blood pressure and heart disease High blood pressure causes heart disease and increases the risk of stroke. This is more likely to develop in people who have high blood pressure readings, are of African descent, or are overweight. Talk with your health care provider about your target blood pressure readings. Have your blood pressure checked: Every 3-5 years if you are 18-39 years of age. Every year if you are 40 years old or older. If you are between the ages of 65 and 75 and are a current or former smoker, ask your health care provider if you should have a one-time screening for abdominal aortic aneurysm (AAA). Diabetes Have regular diabetes screenings. This checks your fasting blood sugar level. Have the screening done: Once every three years after age 45 if you are at a normal weight and have a low risk for diabetes. More often and at a younger age if you are overweight or have a high risk for diabetes. What should I know about preventing infection? Hepatitis B If you have a higher risk for hepatitis B, you should be screened for this virus. Talk with your health care provider to find out if you are at risk for hepatitis B infection. Hepatitis C Blood testing is recommended for: Everyone born from 1945 through 1965. Anyone with known risk factors for hepatitis C. Sexually transmitted infections (STIs) You should be screened each year for STIs, including gonorrhea and chlamydia, if: You are sexually active and are younger than 61 years of age. You are older than 61 years   of age and your health care provider tells you that you are at risk for this type of infection. Your sexual activity has changed since you were last screened, and you are at increased risk for chlamydia or gonorrhea. Ask your health care provider if you are at risk. Ask your health care provider about whether you are at high risk for HIV.  Your health care provider may recommend a prescription medicine to help prevent HIV infection. If you choose to take medicine to prevent HIV, you should first get tested for HIV. You should then be tested every 3 months for as long as you are taking the medicine. Follow these instructions at home: Lifestyle Do not use any products that contain nicotine or tobacco, such as cigarettes, e-cigarettes, and chewing tobacco. If you need help quitting, ask your health care provider. Do not use street drugs. Do not share needles. Ask your health care provider for help if you need support or information about quitting drugs. Alcohol use Do not drink alcohol if your health care provider tells you not to drink. If you drink alcohol: Limit how much you have to 0-2 drinks a day. Be aware of how much alcohol is in your drink. In the [place] one drink equals one 12 oz bottle of beer (355 mL), one 5 oz glass of wine (148 mL), or one 1 oz glass of hard liquor (44 mL). General instructions Schedule regular health, dental, and eye exams. Stay current with your vaccines. Tell your health care provider if: You often feel depressed. You have ever been abused or do not feel safe at home. Summary Adopting a healthy lifestyle and getting preventive care are important in promoting health and wellness. Follow your health care provider's instructions about healthy diet, exercising, and getting tested or screened for diseases. Follow your health care provider's instructions on monitoring your cholesterol and blood pressure. This information is not intended to replace advice given to you by your health care provider. Make sure you discuss any questions you have with your health care provider. Document Revised: 03/24/2020 Document Reviewed: 01/07/2018 Elsevier Patient Education  2022 [employer]  

## 2020-11-16 DIAGNOSIS — R972 Elevated prostate specific antigen [PSA]: Secondary | ICD-10-CM | POA: Diagnosis not present

## 2020-11-16 DIAGNOSIS — N401 Enlarged prostate with lower urinary tract symptoms: Secondary | ICD-10-CM | POA: Diagnosis not present

## 2020-11-16 DIAGNOSIS — R3915 Urgency of urination: Secondary | ICD-10-CM | POA: Diagnosis not present

## 2020-11-16 DIAGNOSIS — R350 Frequency of micturition: Secondary | ICD-10-CM | POA: Diagnosis not present

## 2020-11-27 NOTE — Progress Notes (Signed)
SUBJECTIVE:   Chief compliant/HPI: annual examination  Vincent Byrd is a 60 y.o. male with PMHx of CVA with residual left-sided hemiparesis, tobacco abuse, alcohol use disorder, polycythemia and anxiety who presents today for an annual exam.   History tabs reviewed and updated. Notable history includes history of CVA with residual left-sided hemiparesis, tobacco abuse, alcohol abuse, anxiety.   Concerns today include: States that he fell a week ago today as he was trying to transfer onto his recliner. States that he was celebrating [employer] homecoming and had "too much booze". His arm is swollen and notes soreness to left-side of his rib. His aide was able to come to his house to get him up the following day. He states that he laid on his left arm from 7PM to 1PM the next day. States he has been using triple antibiotic on the bruises and moisture barrier ointment on his pressure sores (on his buttocks). His son also wanted him to ask me about strength and rehabilitation because he has been weak.   Is currently smoking 1 pack per day. He is interested in Nicotine patches and resources to help cut back.  Review of systems form reviewed and notable for chest pain, palpitations, shortness of breath, cough, abdominal pain, NVD, hematochezia, fevers. Does admit to decreased appetite.   OBJECTIVE:   BP 140/79   Pulse 99   SpO2 96%   General: Awake, alert, oriented, disheveled, in no acute distress, pleasant and cooperative with examination HEENT: Normocephalic, atraumatic Cardio: RRR without murmur, 2+ radial, DP and PT pulses b/l Respiratory: Increased expiratory phase in all fields b/l, transmitted upper airway sounds Abdomen: Soft, non-tender to palpation of all quadrants, non-distended, no rebound/guarding, no organomegaly MSK: Left forearm is weakened s/p CVA, he has limitation with AROM of left arm. It is swollen at wrist and elbow but non-tender in all bony areas. Skin: with  approximately 3 x3 cm central abrasion below sternum and left-sided 2x2 abrasion on LUQ of abdomen (pictures below), his right wrist had a small abrasion on his knuckle. His left forearm had about a 6 x2.5 cm wound with slight erythema surrounding but no increased warmth and had some clear drainage Extremities: edema to left wrist and elbow; no lower extremity swelling Neuro: Speech is clear and intact, residual left-sided hemiparesis from stroke, unable to extend his left fingers, no facial asymmetry, speech is clear, follows commands  Psych: Normal mood and affect         ASSESSMENT/PLAN:   Fall Ground level fall that occurred last week while intoxicated. It is concerning that patient was on the ground for approximately 18 hours following the fall. On examination today he is neurovascularly intact. There is edema to both his left wrist and elbow but no obvious bony deformities. He has limited AROM though this is some-what baseline for him given his hemiparesis following his stroke. He does note that he previously was able to extend his fingers and is unable to do so now. Given prolonged immobilization and history of trauma, feel it would be important to rule out upper extremity DVT and fractures to his left arm/forearm.  These orders were placed today, however, patient states he will not be able to undergo the studies today given transportation issues.  He would prefer to come back on Friday for imaging studies if possible. He has abrasions to his chest, left upper abdomen and left forearm that do not look infected. We discussed wound care and return precautions concerning  for cellulitis. His dressing was changed in the office today and Bacitracin was applied to it.  Plan -Left Upper Extremity Venous Doppler to assess for DVT -Left humerus and forearm x-rays -Tylenol for pain control  Hemiparesis affecting left side as late effect of stroke (HCC) With complaints of feeling weaker and some  deconditioning.  I do feel that patient would benefit from physical therapy and he is amenable to this today.  He is also amenable to restarting statin today even though LDL at goal I feel he would benefit due to risk factors of continued tobacco and alcohol use. -Placed order for home health PT -Atorvastatin 40 mg sent to pharmacy  Alcohol use He has not had an alcoholic beverage since last Tuesday before his fall.  He is interested in stopping completely.  I congratulated him and encouraged him to stop alcohol.   Tobacco abuse Due for lung cancer screening.  Unfortunately, he is still smoking 1 pack/day but is thankfully interested in resources to cut down. -He was provided with 1 800 quit now hotline for smoking cessation -Low-dose CT chest order placed  Flu vaccine need Received today.    Annual Examination  See AVS for age appropriate recommendations.  PHQ score 3, reviewed and discussed.  Blood pressure value is 140/79, ideally I would like it <130/80. Patient admits to smoking right before appointment which could contribute to elevation.  Considered the following screening exams based upon USPSTF recommendations: Diabetes screening:  Normal last year, not indicated Screening for elevated cholesterol:  up to date, lipid panel collected in April and at goal  HIV testing:  previously obtained and negative Hepatitis C:  previously obtained and negative  Hepatitis B: not indicated  Syphilis if at high risk:  Not indicated   Reviewed risk factors for latent tuberculosis and not indicated Colorectal cancer screening:  next due February 2023 Lung cancer screening:  Ordered due to 30 pack year history and current smoker.   2021 scan showed "Lung-RADS 2, benign appearance or behavior"  Will follow up on orders placed today and treat as indicated. Follow up in 1 year for next annual examination.  Sabino Dick, DO [facility]

## 2020-11-27 NOTE — Patient Instructions (Addendum)
It was wonderful to see you today.  Please bring ALL of your medications with you to every visit.   Today we talked about:  -You received your Flu vaccine today.  -You are overdue for lung cancer screening. We will call you with the appointment. -I am sending a referral for Physical Therapy.  -I have ordered x-rays of your left arm and chest. Please come in at your earliest convenience for this. -I am ordering an ultrasound study for your left arm to look for a blood clot because of your fall. -Please stop drinking alcohol. -Continue your efforts towards stopping smoking and reducing/eliminating alcohol.  -1-800-QUIT-NOW is a wonderful, and free, resource to help you quit smoking. Please call and they can give you free resources.  Thank you for choosing [facility].   Please call [phone] with any questions about today's appointment.  Please be sure to schedule follow up at the front  desk before you leave today.   Sabino Dick, DO PGY-2 Family Medicine

## 2020-11-28 ENCOUNTER — Ambulatory Visit (INDEPENDENT_AMBULATORY_CARE_PROVIDER_SITE_OTHER): Payer: Medicare HMO | Admitting: Family Medicine

## 2020-11-28 ENCOUNTER — Encounter: Payer: Self-pay | Admitting: Family Medicine

## 2020-11-28 ENCOUNTER — Other Ambulatory Visit: Payer: Self-pay

## 2020-11-28 VITALS — BP 140/79 | HR 99

## 2020-11-28 DIAGNOSIS — Z789 Other specified health status: Secondary | ICD-10-CM | POA: Diagnosis not present

## 2020-11-28 DIAGNOSIS — Z720 Tobacco use: Secondary | ICD-10-CM | POA: Diagnosis not present

## 2020-11-28 DIAGNOSIS — I69354 Hemiplegia and hemiparesis following cerebral infarction affecting left non-dominant side: Secondary | ICD-10-CM

## 2020-11-28 DIAGNOSIS — E785 Hyperlipidemia, unspecified: Secondary | ICD-10-CM | POA: Diagnosis not present

## 2020-11-28 DIAGNOSIS — G8194 Hemiplegia, unspecified affecting left nondominant side: Secondary | ICD-10-CM

## 2020-11-28 DIAGNOSIS — M7989 Other specified soft tissue disorders: Secondary | ICD-10-CM

## 2020-11-28 DIAGNOSIS — Z23 Encounter for immunization: Secondary | ICD-10-CM | POA: Diagnosis not present

## 2020-11-28 DIAGNOSIS — W19XXXA Unspecified fall, initial encounter: Secondary | ICD-10-CM | POA: Insufficient documentation

## 2020-11-28 MED ORDER — ATORVASTATIN CALCIUM 40 MG PO TABS: 40.0000 mg | ORAL_TABLET | Freq: Every day | ORAL | 2 refills | Status: DC

## 2020-11-28 MED ORDER — ACETAMINOPHEN ER 650 MG PO TBCR: 650.0000 mg | EXTENDED_RELEASE_TABLET | Freq: Three times a day (TID) | ORAL | 1 refills | Status: DC | PRN

## 2020-11-28 NOTE — Assessment & Plan Note (Addendum)
Ground level fall that occurred last week while intoxicated. It is concerning that patient was on the ground for approximately 18 hours following the fall. On examination today he is neurovascularly intact. There is edema to both his left wrist and elbow but no obvious bony deformities. He has limited AROM though this is some-what baseline for him given his hemiparesis following his stroke. He does note that he previously was able to extend his fingers and is unable to do so now. Given prolonged immobilization and history of trauma, feel it would be important to rule out upper extremity DVT and fractures to his left arm/forearm.  These orders were placed today, however, patient states he will not be able to undergo the studies today given transportation issues.  He would prefer to come back on Friday for imaging studies if possible. He has abrasions to his chest, left upper abdomen and left forearm that do not look infected. We discussed wound care and return precautions concerning for cellulitis. His dressing was changed in the office today and Bacitracin was applied to it.  Plan -Left Upper Extremity Venous Doppler to assess for DVT -Left humerus and forearm x-rays -Tylenol for pain control

## 2020-11-28 NOTE — Assessment & Plan Note (Signed)
Received today

## 2020-11-28 NOTE — Assessment & Plan Note (Signed)
Due for lung cancer screening.  Unfortunately, he is still smoking 1 pack/day but is thankfully interested in resources to cut down. -He was provided with 1 800 quit now hotline for smoking cessation -Low-dose CT chest order placed

## 2020-11-28 NOTE — Assessment & Plan Note (Addendum)
With complaints of feeling weaker and some deconditioning.  I do feel that patient would benefit from physical therapy and he is amenable to this today.  He is also amenable to restarting statin today even though LDL at goal I feel he would benefit due to risk factors of continued tobacco and alcohol use. -Placed order for home health PT -Atorvastatin 40 mg sent to pharmacy

## 2020-11-28 NOTE — Assessment & Plan Note (Signed)
He has not had an alcoholic beverage since last Tuesday before his fall.  He is interested in stopping completely.  I congratulated him and encouraged him to stop alcohol.

## 2020-12-01 ENCOUNTER — Other Ambulatory Visit: Payer: Self-pay

## 2020-12-01 ENCOUNTER — Ambulatory Visit (HOSPITAL_COMMUNITY)
Admission: RE | Admit: 2020-12-01 | Discharge: 2020-12-01 | Disposition: A | Discharge status: Home / Self Care | Payer: Medicare HMO | Source: Ambulatory Visit | Attending: Family Medicine | Admitting: Family Medicine

## 2020-12-01 DIAGNOSIS — R0781 Pleurodynia: Secondary | ICD-10-CM | POA: Insufficient documentation

## 2020-12-01 DIAGNOSIS — M79622 Pain in left upper arm: Secondary | ICD-10-CM | POA: Insufficient documentation

## 2020-12-01 DIAGNOSIS — W19XXXA Unspecified fall, initial encounter: Secondary | ICD-10-CM

## 2020-12-01 DIAGNOSIS — M79632 Pain in left forearm: Secondary | ICD-10-CM | POA: Diagnosis not present

## 2020-12-06 ENCOUNTER — Telehealth: Payer: Self-pay

## 2020-12-06 ENCOUNTER — Other Ambulatory Visit: Payer: Self-pay | Admitting: Family Medicine

## 2020-12-06 DIAGNOSIS — M7989 Other specified soft tissue disorders: Secondary | ICD-10-CM

## 2020-12-06 NOTE — Telephone Encounter (Signed)
Scheduled patient for imaging at 2pm tomorrow after UE [place].   Called patient and informed.   Veronda Prude, RN

## 2020-12-06 NOTE — Telephone Encounter (Signed)
Patient calls nurse line to discuss concerns after having fall at the end of October. Patient reports that he has noticed a "lump" on left thigh. Denies pain, swelling or warmth.   Patient also reports that since fall he has been having increased pain in his left hand and fingers. Patient was asking about getting imaging on both hand and leg.   Patient was seen in office on 11/1.  Please advise.   Veronda Prude, RN

## 2020-12-07 ENCOUNTER — Ambulatory Visit (HOSPITAL_BASED_OUTPATIENT_CLINIC_OR_DEPARTMENT_OTHER)
Admission: RE | Admit: 2020-12-07 | Discharge: 2020-12-07 | Disposition: A | Discharge status: Home / Self Care | Payer: Medicare HMO | Source: Ambulatory Visit | Attending: Family Medicine | Admitting: Family Medicine

## 2020-12-07 ENCOUNTER — Ambulatory Visit (HOSPITAL_COMMUNITY)
Admission: RE | Admit: 2020-12-07 | Discharge: 2020-12-07 | Disposition: A | Discharge status: Home / Self Care | Payer: Medicare HMO | Source: Ambulatory Visit | Attending: Family Medicine | Admitting: Family Medicine

## 2020-12-07 ENCOUNTER — Other Ambulatory Visit: Payer: Self-pay

## 2020-12-07 ENCOUNTER — Other Ambulatory Visit: Payer: Self-pay | Admitting: Family Medicine

## 2020-12-07 DIAGNOSIS — M7989 Other specified soft tissue disorders: Secondary | ICD-10-CM | POA: Insufficient documentation

## 2020-12-07 DIAGNOSIS — M25539 Pain in unspecified wrist: Secondary | ICD-10-CM | POA: Diagnosis not present

## 2020-12-07 DIAGNOSIS — M25532 Pain in left wrist: Secondary | ICD-10-CM

## 2020-12-07 DIAGNOSIS — Y929 Unspecified place or not applicable: Secondary | ICD-10-CM | POA: Diagnosis not present

## 2020-12-07 DIAGNOSIS — M79642 Pain in left hand: Secondary | ICD-10-CM

## 2020-12-07 DIAGNOSIS — Y939 Activity, unspecified: Secondary | ICD-10-CM | POA: Diagnosis not present

## 2020-12-07 DIAGNOSIS — W19XXXA Unspecified fall, initial encounter: Secondary | ICD-10-CM | POA: Insufficient documentation

## 2020-12-07 DIAGNOSIS — I82602 Acute embolism and thrombosis of unspecified veins of left upper extremity: Secondary | ICD-10-CM | POA: Insufficient documentation

## 2020-12-07 NOTE — Progress Notes (Signed)
VASCULAR LAB    Left upper extremity and left lower extremity venous duplex has been performed.  See CV proc for preliminary results.  Called report   Vincent Streater, RVT 12/07/2020, 2:28 PM

## 2020-12-12 ENCOUNTER — Telehealth: Payer: Self-pay

## 2020-12-12 NOTE — Telephone Encounter (Signed)
Patient calls nurse line reporting a fall on 10/30. Patient reports he was evaluated for this, however has noticed a small knot on his left side "around my pelvic area." Patient reports he did not notice this before fall. Patient denies any pain, warmth or redness. Patient reports he can move it around. Patient denies any chest pain or SOB. Patient offered apt, however has transportation issues. Patient scheduled for 11/18.  Red flags discussed with patient.

## 2020-12-13 ENCOUNTER — Ambulatory Visit (HOSPITAL_COMMUNITY)
Admission: RE | Admit: 2020-12-13 | Discharge: 2020-12-13 | Disposition: A | Discharge status: Home / Self Care | Payer: Medicare HMO | Source: Ambulatory Visit | Attending: Family Medicine | Admitting: Family Medicine

## 2020-12-13 ENCOUNTER — Other Ambulatory Visit: Payer: Self-pay

## 2020-12-13 DIAGNOSIS — M79642 Pain in left hand: Secondary | ICD-10-CM | POA: Diagnosis not present

## 2020-12-13 DIAGNOSIS — M25532 Pain in left wrist: Secondary | ICD-10-CM | POA: Diagnosis not present

## 2020-12-13 DIAGNOSIS — M7989 Other specified soft tissue disorders: Secondary | ICD-10-CM | POA: Diagnosis not present

## 2020-12-15 ENCOUNTER — Other Ambulatory Visit: Payer: Self-pay

## 2020-12-15 ENCOUNTER — Ambulatory Visit (INDEPENDENT_AMBULATORY_CARE_PROVIDER_SITE_OTHER): Payer: Medicare HMO | Admitting: Family Medicine

## 2020-12-15 VITALS — BP 150/78 | HR 84 | Ht 68.0 in

## 2020-12-15 DIAGNOSIS — I69354 Hemiplegia and hemiparesis following cerebral infarction affecting left non-dominant side: Secondary | ICD-10-CM | POA: Diagnosis not present

## 2020-12-15 DIAGNOSIS — W19XXXA Unspecified fall, initial encounter: Secondary | ICD-10-CM

## 2020-12-15 MED ORDER — HYDROCODONE-ACETAMINOPHEN 5-325 MG PO TABS: 1.0000 | ORAL_TABLET | Freq: Four times a day (QID) | ORAL | 0 refills | Status: DC | PRN

## 2020-12-15 NOTE — Progress Notes (Signed)
    SUBJECTIVE:   CHIEF COMPLAINT / HPI:   Fall follow-up Patient reportedly fell on 10/30 and was evaluated after this fall.  He has hemiparesis of his left side and fell on his left hip and upper extremity.  He was seen in our clinic on 1 November and then called the clinic regarding worsening swelling and pain so further imaging was ordered of his wrist and forearm.  Patient reports that he has been taking the medication prescribed to him since last seen but it is not helping at all.  He has been applying topical medications to the scab on his left forearm.  The imaging results from the hand and wrist x-ray showed swelling but no bony abnormalities.  Patient reports that at baseline he has little to no function of his left upper extremity but he can usually straighten his fingers out using his right hand.  He is unable to do this at this time due to pain.  Patient also reports that since being seen he has noticed a lump in his left side which was not present before.  It is just above his hip.  PERTINENT  PMH / PSH: Left hemiparesis s/p CVA  OBJECTIVE:   BP (!) 150/78   Pulse 84   Ht 5\' 8"  (1.727 m)   SpO2 94%   BMI 25.09 kg/m   General: Chronically ill-appearing 60 year old male an electric wheelchair, left arm contracted up to abdomen Respiratory: Patient has thick congested cough which he reports is his baseline because he is a smoker Cardiac: Regular rate Abdomen: Palpable 6 cm x 0.5 cm hard mobile mass appreciated in left lower quadrant.  Appears to be healing hematoma.  MSK: Patient has healing wound to left forearm, decreased swelling compared to previous exam pictures, fingers of left hand contracted shot but able to extend them which causes pain predominantly in the left wrist.  ASSESSMENT/PLAN:   Fall Patient continues to have pain on his left side and his left wrist and forearm.  Edema improved from previous exam when compared to pictures.  Limited range of motion of the  wrist and fingers although patient reports he has limited motion at baseline.  He does acknowledge that this is worse than normal.  Considerable pain with extension of the fingers.  Patient is already had x-rays of the left upper extremity with wrist and hand occurring 2 days ago.  Ultrasound for DVT was negative.  I have placed referral for occupational therapy to help with range of motion of the left upper extremity as well as sports medicine to evaluate for tendinopathy's.  Provided patient with a sling and recommended rice therapy until these evaluations have occurred.  Sent prescription for Norco 5-3 25 for patient to take at night to help with sleep.  Called sports medicine and they brought a sling up for the patient to keep his left arm elevated.  Greatly appreciate their assistance with this.  Regarding the new abdominal lump that he is feeling on his left side this appears to be a healing hematoma.  We will continue to monitor and if persist consider further imaging.  Strict ED and return precautions given.  Patient has no further questions or concerns.     76, MD [facility]

## 2020-12-15 NOTE — Assessment & Plan Note (Addendum)
Patient continues to have pain on his left side and his left wrist and forearm.  Edema improved from previous exam when compared to pictures.  Limited range of motion of the wrist and fingers although patient reports he has limited motion at baseline.  He does acknowledge that this is worse than normal.  Considerable pain with extension of the fingers.  Patient is already had x-rays of the left upper extremity with wrist and hand occurring 2 days ago.  Ultrasound for DVT was negative.  I have placed referral for occupational therapy to help with range of motion of the left upper extremity as well as sports medicine to evaluate for tendinopathy's.  Provided patient with a sling and recommended rice therapy until these evaluations have occurred.  Sent prescription for Norco 5-3 25 for patient to take at night to help with sleep.  Called sports medicine and they brought a sling up for the patient to keep his left arm elevated.  Greatly appreciate their assistance with this.  Regarding the new abdominal lump that he is feeling on his left side this appears to be a healing hematoma.  We will continue to monitor and if persist consider further imaging.  Strict ED and return precautions given.  Patient has no further questions or concerns.

## 2020-12-15 NOTE — Patient Instructions (Signed)
It was wonderful seeing you today.  I am sorry you are still having this left wrist pain.  I sent a prescription for some pain medicines to your pharmacy.  I gave you 12 pills I recommend you take these at night to help you sleep but continue to use Tylenol throughout the day.  I have put a referral in for occupational therapy as well as sports medicine.  Someone will be calling you for these appointments.  If you have any questions, concerns, worsening pains please seek medical attention immediately.  I hope you have a great afternoon!

## 2020-12-18 ENCOUNTER — Ambulatory Visit: Payer: Medicare HMO

## 2020-12-25 ENCOUNTER — Telehealth: Payer: Self-pay

## 2020-12-25 ENCOUNTER — Encounter: Payer: Self-pay | Admitting: Family Medicine

## 2020-12-25 ENCOUNTER — Ambulatory Visit (INDEPENDENT_AMBULATORY_CARE_PROVIDER_SITE_OTHER): Payer: Medicare HMO | Admitting: Family Medicine

## 2020-12-25 VITALS — BP 144/84 | Ht 68.75 in | Wt 160.0 lb

## 2020-12-25 DIAGNOSIS — M79632 Pain in left forearm: Secondary | ICD-10-CM | POA: Diagnosis not present

## 2020-12-25 MED ORDER — BACLOFEN 5 MG PO TABS: 1.0000 | ORAL_TABLET | Freq: Three times a day (TID) | ORAL | 1 refills | Status: DC | PRN

## 2020-12-25 MED ORDER — OXYCODONE-ACETAMINOPHEN 5-325 MG PO TABS: 1.0000 | ORAL_TABLET | ORAL | 0 refills | Status: DC | PRN

## 2020-12-25 NOTE — Patient Instructions (Addendum)
We will go ahead with an MRI with and without contrast of your forearm to further assess why you're still in this much pain a month out from your fall - it's likely due to a hematoma of the forearm but we need to rule out other causes as well. Take baclofen three times a day to help with muscle pain. Topical voltaren gel up to 4 times a day to help with pain and inflammation. Take oxycodone as needed for severe pain - do NOT take tylenol with this (it has tylenol in it). I will call you with the MRI results and next steps.

## 2020-12-25 NOTE — Progress Notes (Signed)
ESPIRIDION SUPINSKI - 60 y.o. male MRN [id number]  Date of birth: 1960-05-28  SUBJECTIVE:   CC: Left arm and hand pain   Mr. 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  Patient reports 1 month prior he was taking alcoholic shots with his friends when he fell transferring from his recliner to his wheelchair.  During the fall he injured his arm on an exposed piece of wood on the recliner which caused a laceration to the dorsal surface of the arm.  He reports seeing family medicine after the incident who ordered x-rays of the L humerus, forearm, wrist, and hand which were negative for fracture.  DVT ultrasound was also performed and negative.  At follow-up visit with family medicine, patient continued to have pain and was provided with a sling and recommended to do RICE therapy.  He also received prescription for Norco 5-3 25 every 6 hours as needed, 12 tablets, which patient reports taking at night when his pain is at its worst.  Today he has 2 Norco pills left.  He also has a healing abdominal hematoma.  Today, he endorses pain at rest and to palpation on the volar side of his forearm which extends into the volar part of his hand.  He reports at baseline he has flexor contractures of his fingers though is able to flex and extend his fingers slightly to grasp.  Prior to his injury he was able to open a bag of Goody powders and now he is no longer able to do so.  Now he is no longer able to use the left hand at all secondary to pain.  Pain is an aching stabbing pain, no tingling, numbness only on the dorsal side of the forearm around the laceration. He is requesting something stronger than the Norco that he was prescribed for pain.  He reports no longer taking the baclofen for muscle spasms.  He limits his use of Goody powders and only  uses them once in a while.   ROS: Negative aside from above.  DATA REVIEWED:  DG forearm left: There is no evidence of fracture or other focal bone lesions.  Soft tissues are unremarkable. By: Corlis Leak M.D.   On: 12/01/2020 11:19  DG humerus left: There is no evidence of fracture or other focal bone lesions.  Soft tissues are unremarkable. By: Corlis Leak M.D.   On: 12/01/2020 11:20  DG hand complete left: No acute fracture or dislocation of the left wrist or hand.  With left wrist soft tissue swelling. By: Darliss Cheney M.D.   On: 12/13/2020 22:55  DG wrist complete left: No acute fracture or dislocation of the left wrist or hand.  With left wrist soft tissue swelling. By: Darliss Cheney M.D.   On: 12/13/2020 22:55  PHYSICAL EXAM:  VS: BP:(!) 144/84  HR: bpm  TEMP: ( )  RESP:   HT:5' 8.75" (174.6 cm)   WT:160 lb (72.6 kg)  BMI:23.81 PHYSICAL EXAM: Gen: NAD, alert, cooperative with exam, well-appearing HEENT: clear conjunctiva, atraumatic CV: extremities appear well-perfused, no lower extremity edema Resp: non-labored breathing on room air Skin: Dorsal forearm laceration 4 cm, appears to be healing well Neuro: Awake, alert, oriented X4.  Left hemiparesis. Psych:  alert and oriented MSK: Visible atrophy of LUE musculature. Increased tone LUE with  finger flexion contractures.  Limited ROM of LUE further limited by pain.  Tenderness to palpation volar forearm, volar wrist, volar surface of the hand.  Ultrasound left forearm: Muscular atrophy, decreased echogenicity noted concerning for possible hematoma  ASSESSMENT & PLAN:   Mr. 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  Patient has persistent pain following a fall and injury to the UA 1 month prior.  Imaging negative for acute fracture and DVT.   Compartment syndrome less likely 1 month out.  On ultrasound patient did have decreased echogenicity concerning for possible hematoma, especially given report of additional hematoma in the abdomen.  The hematoma could be compressing structures in the left forearm causing pain in the forearm and distal LUE.  We will further evaluate with left forearm MRI with and without contrast.  Will prescribe medication for symptom relief. Plan: - Left forearm MRI with and without contrast -Restart baclofen 5 mg 3 times daily  -Percocet 5-325 mg every 4 hours  -OTC Voltaren gel every 4 times daily as needed -We will arrange for follow-up after review of MRI results

## 2020-12-25 NOTE — Telephone Encounter (Signed)
Vincent Byrd [facility] OT calls nurse line requesting verbal orders for [facility] OT as follows.   1x a week for 6 weeks   Verbal order given to [place] per protocol.

## 2020-12-25 NOTE — Telephone Encounter (Signed)
Patient calls nurse line requesting order be placed for MRI for lump on L thigh. Patient reports that he was unable to get imaging at [facility] due to issues with accessibility.   Patient is requesting that order be sent to [facility] and preferably scheduled on the same day as forearm MRI on 01/02/21.  Please advise.   Veronda Prude, RN

## 2020-12-27 ENCOUNTER — Other Ambulatory Visit: Payer: Self-pay | Admitting: Family Medicine

## 2020-12-27 DIAGNOSIS — M7989 Other specified soft tissue disorders: Secondary | ICD-10-CM

## 2020-12-27 DIAGNOSIS — W19XXXS Unspecified fall, sequela: Secondary | ICD-10-CM

## 2021-01-02 ENCOUNTER — Other Ambulatory Visit: Payer: Self-pay

## 2021-01-02 ENCOUNTER — Ambulatory Visit (HOSPITAL_COMMUNITY)
Admission: RE | Admit: 2021-01-02 | Discharge: 2021-01-02 | Disposition: A | Discharge status: Home / Self Care | Payer: Medicare HMO | Source: Ambulatory Visit | Attending: Family Medicine | Admitting: Family Medicine

## 2021-01-02 ENCOUNTER — Telehealth: Payer: Self-pay | Admitting: *Deleted

## 2021-01-02 ENCOUNTER — Telehealth: Payer: Self-pay | Admitting: Family Medicine

## 2021-01-02 DIAGNOSIS — M79632 Pain in left forearm: Secondary | ICD-10-CM | POA: Insufficient documentation

## 2021-01-02 MED ORDER — OXYCODONE-ACETAMINOPHEN 5-325 MG PO TABS: 1.0000 | ORAL_TABLET | ORAL | 0 refills | Status: DC | PRN

## 2021-01-02 MED ORDER — GADOBUTROL 1 MMOL/ML IV SOLN
7.0000 mL | Freq: Once | INTRAVENOUS | Status: AC | PRN
  Administered 2021-01-02: 7 mL via INTRAVENOUS

## 2021-01-02 NOTE — Telephone Encounter (Signed)
Ok - sent this in.  Thanks! 

## 2021-01-02 NOTE — Telephone Encounter (Signed)
Verbal order request to add 1 additional visit at the end of patient's regimen beginning 02/05/2021.  Verbal ok given to [employer].  Elke Holtry,CMA

## 2021-01-02 NOTE — Telephone Encounter (Signed)
-----   Message from [facility], LAT sent at 01/02/2021 10:55 AM EST ----- Regarding: RE: med request [facility] cancelled it bc they are out of the medicine and have been for a few months. Pt would like the Rx send to [facility] on [address] in [place]. Thanks!  ----- Message ----- From: Lenda Kelp, MD Sent: 01/02/2021  10:43 AM EST To: Rutha Bouchard, LAT Subject: RE: med request                                Aundra Millet - can you call [facility] to make sure this is correct just because it's a controlled substance.  Weird if they did.  But then I'll send it back in if so.  ----- Message ----- From: Rutha Bouchard, LAT Sent: 01/01/2021   1:33 PM EST To: Lenda Kelp, MD Subject: FW: med request                                 ----- Message ----- From: Lizbeth Bark Sent: 01/01/2021  11:54 AM EST To: Rutha Bouchard, LAT Subject: med request                                    Please resend oxyCODONE-acetaminophen (PERCOCET/ROXICET) 5-325 MG tablet to pharmacy. Per patient [facility] cancelled it.

## 2021-01-04 ENCOUNTER — Telehealth: Payer: Self-pay

## 2021-01-04 NOTE — Telephone Encounter (Signed)
Patient called and informed to reach out to Sports Medicine for results.   Veronda Prude, RN

## 2021-01-04 NOTE — Telephone Encounter (Signed)
Patient calls nurse line requesting to speak with Dr. Melba Coon regarding results from MRI on 12/6.   Please advise.   Veronda Prude, RN

## 2021-01-05 NOTE — Telephone Encounter (Signed)
Order closed.  Mialynn Shelvin,CMA

## 2021-01-05 NOTE — Telephone Encounter (Signed)
MRI was denied and prior authorization will need to be restarted with additional information added.  Before I process this again, are you still wanting to order this for the patient?  I see the note below stating that he didn't believe there was anything wrong with his leg and didn't need this MRI performed.  Please advise.  Rachard Isidro,CMA

## 2021-01-09 ENCOUNTER — Other Ambulatory Visit: Payer: Self-pay | Admitting: Student in an Organized Health Care Education/Training Program

## 2021-01-10 ENCOUNTER — Telehealth: Payer: Self-pay

## 2021-01-10 NOTE — Telephone Encounter (Signed)
CT Chest scheduled at [facility] on 12/21 @1030am . Patient needs to arrive 15 minutes early.

## 2021-01-12 ENCOUNTER — Other Ambulatory Visit: Payer: Medicare HMO

## 2021-01-15 ENCOUNTER — Ambulatory Visit: Payer: Medicare HMO

## 2021-01-17 ENCOUNTER — Ambulatory Visit (HOSPITAL_COMMUNITY): Payer: Medicare HMO

## 2021-01-25 ENCOUNTER — Telehealth: Payer: Self-pay | Admitting: Family Medicine

## 2021-01-25 ENCOUNTER — Telehealth: Payer: Self-pay

## 2021-01-25 NOTE — Telephone Encounter (Signed)
This needs to be addressed by his PCP.  I called patient to inform him to contact his PCP.  No answer/unable to leave vm.  Routing to Alma Friendly., CMA for Dr. Lazaro Arms return on 01/31/20.

## 2021-01-25 NOTE — Telephone Encounter (Signed)
Patient calls nurse line requesting a steroid for weight gain. Patient reports he lost "a lot" of weight after his fall and reports steroids "boost" weight gain for him. Patient reports he has noticed more bed sores due to loss of weight. Patient denies any signs of infection from what he can tell. No fevers, body aches or foul smell. He also reached out to sports medicine about this.   Red flags given to patient.   Patient scheduled for next week with PCP.

## 2021-01-25 NOTE — Telephone Encounter (Signed)
Dr. Lazaro Arms pt cld states due to his constant sitting being wheelchair bound he has developed some blisters & sores on buttocks. ---- Pt request Rx for steroids to help increase his weight? Per patient steroid increase his appetite  making him eat more & gain weight.  --Forwarding request to provider for review.  - Pt uses Mail Order pharmacy :   [facility] (Now [facility]) - [address] [place], [place] - [address] Phone:  [phone]  Fax:  [phone]    --Please call pt if there are any questions or concerns @ [phone]  --glh

## 2021-01-31 NOTE — Progress Notes (Signed)
SUBJECTIVE:   CHIEF COMPLAINT / HPI:   Discuss Weight Loss, Hand Pain  Vincent Byrd is a 61 y.o. male with PMHx of CVA with residual left-sided hemiparesis, tobacco abuse, alcohol use disorder, polycythemia and anxiety who presents to the [facility] clinic to discuss weight loss. I previously saw the patient on 11/1 for an annual examination and he had informed me of a fall that he had while intoxicated. Since that fall (which occurred on October 25th). Work up at that time included x-rays of the L humerus, forearm, wrist, and hand which were negative for fracture.  Left upper extremity DVT ultrasound was also performed and negative. At that time, a referral to Home Health was also placed. Given that he continued to have continued pain, he was referred to Sports Medicine and seen on 11/28 where an MRI of his left forearm was obtained due to continued pain. Results showed "extensor greater than flexor myositis in the mid to distal forearm with fairly well demarcated area of extensor muscle non-enhancement measuring approximately 3.7 x 1.6 x 5.0 cm, consistent with myonecrosis".  This was on the side that was affected by patients previous CVA. Sports medicine added occupational therapy to help with pain and recover as much function as possible.  He presents today to discuss ongoing weight loss since that fall. Requests "something to get his weight back up". States steroids have helped him with this in the past. States that he didn't eat for 1.5 weeks after the fall because he had no appetite. His appetite has slowly improved. Has developed pressure sores on his buttocks since he has been losing weight. [facility] comes to his house 1-2x week. Has PT and OT. He lives with his son who helps him with transfers, grocery shopping and meals. Has an aide that comes Tuesday-Friday for 2 hours that helps with bathing/cooking on occasion. Sleeps in the recliner, pulls himself from side to side.  Smoking 3/4-1ppd.  Interested in patches. Has not had alcohol in 1 week, had two 40 oz. States it is difficult to not drink during Sunday night football.  Has pain in left hand/wrist and his backside, where his sores are. Has been using Percocet, is almost out and requesting refill.  PERTINENT  PMH / PSH:  Past Medical History:  Diagnosis Date   Hemiparesis (HCC)    affecting left side, secondary to stroke in 1997   Stroke (HCC)    OBJECTIVE:   BP (!) 153/88    Pulse 83    Ht 5' 8.75" (1.746 m)    Wt 140 lb 6.4 oz (63.7 kg)    SpO2 100%    BMI 20.88 kg/m    General: NAD, pleasant, able to participate in exam, smells of tobacco Cardiac: RRR, no murmurs. Respiratory: scant wheezing in upper lobes, transmitted upper airway sounds, increased expiratory phase Abdomen: Bowel sounds present, nontender, thin Extremities: no edema or cyanosis. Skin: Healing abrasions to left forearm, center chest and left flank Neuro: flexural deformity to left hand, decreased sensation to left hand, minimal ROM to left upper extremity. Wheelchair bound. Speech is clear, able to follow commands Psych: Normal affect and mood       ASSESSMENT/PLAN:   Abnormal weight loss 20 lb weight loss since November. Also with deconditioning. Reports appetite is slowly improving and he is eating more regularly now. He appears thin and suspect protein malnutrition. -Ensure shakes prescribed -After extensive discussion about risks/benefits of steroids for weight gain (and after discussion with  preceptor, Dr. Deirdre Priest), shared decision making to give short course. 20 mg x 7 days. -Declines SNF placement. -Will proceed with CCM referral to see if other resources are available to patient.  Tobacco abuse Continues to smoke.  Appears that he missed his low-dose CT lung cancer screening on 12/21.  This will need to be rescheduled.  Encouraged him to cut back on smoking.  -1-800-QUIT-NOW for cessation -CCM referral placed for assistance as  well  Alcohol use Reports no alcohol in 1 week but had two 40 oz beers the previous week. We discussed the detrimental effects alcohol can have on the body and that it can predispose him to further falls.  -CCM referral for resources for cessation   Left forearm pain Swelling has resolved from when I last saw him. Continues to have some pain- reports having to take a Percocet nightly for this. Encouraged scheduled pain control with Tylenol and Ibuprofen every 6-8 hours. Sent in Oxy 5 mg to use daily as needed for breakthrough pain- advised NO ALCOHOL with this medication. Advised this would NOT be a long-term medication and to use sparingly. If he requires more long-term pain control, discussed referral to pain management.  -Tylenol and Ibuprofen (with food) every 6-8 hours -Oxy IR 5 mg daily PRN breakthrough pain -PT/OT  -Heat, ice, etc  -MiraLAX sent given narcotic use  Pressure ulcer Reported to buttocks. Unable to view today but patient reports the skin has not yet broken. Discussed off-loading pressure to help prevent progression. He is able to turn himself in his recliner.       Sabino Dick, DO [facility]

## 2021-02-01 ENCOUNTER — Telehealth: Payer: Self-pay

## 2021-02-01 ENCOUNTER — Ambulatory Visit (INDEPENDENT_AMBULATORY_CARE_PROVIDER_SITE_OTHER): Payer: Medicare HMO | Admitting: Family Medicine

## 2021-02-01 ENCOUNTER — Other Ambulatory Visit: Payer: Self-pay

## 2021-02-01 ENCOUNTER — Encounter: Payer: Self-pay | Admitting: Family Medicine

## 2021-02-01 VITALS — BP 153/88 | HR 83 | Ht 68.75 in | Wt 140.4 lb

## 2021-02-01 DIAGNOSIS — L8990 Pressure ulcer of unspecified site, unspecified stage: Secondary | ICD-10-CM | POA: Insufficient documentation

## 2021-02-01 DIAGNOSIS — M79632 Pain in left forearm: Secondary | ICD-10-CM | POA: Insufficient documentation

## 2021-02-01 DIAGNOSIS — R634 Abnormal weight loss: Secondary | ICD-10-CM

## 2021-02-01 DIAGNOSIS — Z720 Tobacco use: Secondary | ICD-10-CM

## 2021-02-01 DIAGNOSIS — I69354 Hemiplegia and hemiparesis following cerebral infarction affecting left non-dominant side: Secondary | ICD-10-CM

## 2021-02-01 DIAGNOSIS — Z789 Other specified health status: Secondary | ICD-10-CM | POA: Diagnosis not present

## 2021-02-01 DIAGNOSIS — F1090 Alcohol use, unspecified, uncomplicated: Secondary | ICD-10-CM

## 2021-02-01 DIAGNOSIS — R5381 Other malaise: Secondary | ICD-10-CM

## 2021-02-01 DIAGNOSIS — L89309 Pressure ulcer of unspecified buttock, unspecified stage: Secondary | ICD-10-CM | POA: Diagnosis not present

## 2021-02-01 MED ORDER — POLYETHYLENE GLYCOL 3350 17 GM/SCOOP PO POWD: 17.0000 g | Freq: Every day | ORAL | 1 refills | Status: AC

## 2021-02-01 MED ORDER — ENSURE ACTIVE HIGH PROTEIN PO LIQD: 16.0000 [oz_av] | Freq: Two times a day (BID) | ORAL | 3 refills | Status: DC

## 2021-02-01 MED ORDER — OXYCODONE HCL 5 MG PO TABS: 5.0000 mg | ORAL_TABLET | Freq: Every day | ORAL | 0 refills | Status: DC | PRN

## 2021-02-01 MED ORDER — PREDNISONE 20 MG PO TABS: 20.0000 mg | ORAL_TABLET | Freq: Every day | ORAL | 0 refills | Status: DC

## 2021-02-01 MED ORDER — IBUPROFEN 600 MG PO TABS: 600.0000 mg | ORAL_TABLET | Freq: Four times a day (QID) | ORAL | 0 refills | Status: DC | PRN

## 2021-02-01 NOTE — Assessment & Plan Note (Signed)
Reports no alcohol in 1 week but had two 40 oz beers the previous week. We discussed the detrimental effects alcohol can have on the body and that it can predispose him to further falls.  -CCM referral for resources for cessation

## 2021-02-01 NOTE — Assessment & Plan Note (Signed)
Continues to smoke.  Appears that he missed his low-dose CT lung cancer screening on 12/21.  This will need to be rescheduled.  Encouraged him to cut back on smoking.  -1-800-QUIT-NOW for cessation -CCM referral placed for assistance as well

## 2021-02-01 NOTE — Assessment & Plan Note (Signed)
Reported to buttocks. Unable to view today but patient reports the skin has not yet broken. Discussed off-loading pressure to help prevent progression. He is able to turn himself in his recliner.

## 2021-02-01 NOTE — Assessment & Plan Note (Signed)
20 lb weight loss since November. Also with deconditioning. Reports appetite is slowly improving and he is eating more regularly now. He appears thin and suspect protein malnutrition. -Ensure shakes prescribed -After extensive discussion about risks/benefits of steroids for weight gain (and after discussion with preceptor, Dr. Deirdre Priest), shared decision making to give short course. 20 mg x 7 days. -Declines SNF placement. -Will proceed with CCM referral to see if other resources are available to patient.

## 2021-02-01 NOTE — Assessment & Plan Note (Signed)
Swelling has resolved from when I last saw him. Continues to have some pain- reports having to take a Percocet nightly for this. Encouraged scheduled pain control with Tylenol and Ibuprofen every 6-8 hours. Sent in Oxy 5 mg to use daily as needed for breakthrough pain- advised NO ALCOHOL with this medication. Advised this would NOT be a long-term medication and to use sparingly. If he requires more long-term pain control, discussed referral to pain management.  -Tylenol and Ibuprofen (with food) every 6-8 hours -Oxy IR 5 mg daily PRN breakthrough pain -PT/OT  -Heat, ice, etc  -MiraLAX sent given narcotic use

## 2021-02-01 NOTE — Telephone Encounter (Signed)
Vincent Byrd from [facility] calling for OT verbal orders as follows:  1 time(s) weekly for 1 week(s)  Verbal orders given per [facility] protocol  Talbot Grumbling, RN

## 2021-02-01 NOTE — Patient Instructions (Signed)
It was wonderful to see you today.  Please bring ALL of your medications with you to every visit.   Today we talked about:  -We talked about the importance of taking pressure off your bottom. This will keep the pressure ulcer from progressing. Switch positions every few hours. -Talk with [facility] to see if PT and OT can come more often -I am giving a 7-day course of steroids to help you. -I sent a prescription for a nutritional supplement called Ensure. Continue to eat 3 meals a day with snacks in between. -I sent a prescription for Ibuprofen as well. Take Tylenol and Ibuprofen (with food) regularly every 6-8 hours to stay on top of your pain. -I sent in a narcotic pain medication that you can take as needed for severe breakthrough pain. Take MiraLAX with this to keep you from getting constipated. Do NOT DRINK ALCOHOL while on this medication.   Thank you for choosing [facility].   Please call [phone] with any questions about today's appointment.  Please be sure to schedule follow up at the front  desk before you leave today.   Sabino Dick, DO PGY-2 Family Medicine

## 2021-02-02 ENCOUNTER — Telehealth: Payer: Self-pay | Admitting: *Deleted

## 2021-02-02 ENCOUNTER — Other Ambulatory Visit: Payer: Self-pay | Admitting: Family Medicine

## 2021-02-02 DIAGNOSIS — I69354 Hemiplegia and hemiparesis following cerebral infarction affecting left non-dominant side: Secondary | ICD-10-CM

## 2021-02-02 DIAGNOSIS — R634 Abnormal weight loss: Secondary | ICD-10-CM

## 2021-02-02 DIAGNOSIS — R5381 Other malaise: Secondary | ICD-10-CM

## 2021-02-02 DIAGNOSIS — W19XXXD Unspecified fall, subsequent encounter: Secondary | ICD-10-CM

## 2021-02-02 DIAGNOSIS — M79632 Pain in left forearm: Secondary | ICD-10-CM

## 2021-02-02 NOTE — Chronic Care Management (AMB) (Signed)
°  Care Management   Note  02/02/2021 Name: Vincent Byrd MRN: [id number] DOB: 05/31/1960  Vincent Byrd is a 61 y.o. year old male who is a primary care patient of Sabino Dick, DO. I reached out to Vincent Byrd by phone today in response to a referral sent by Mr. JOSHAWN CRISSMAN primary care provider.   [name] was given information about care management services today including:  Care management services include personalized support from designated clinical staff supervised by his physician, including individualized plan of care and coordination with other care providers 24/7 contact phone numbers for assistance for urgent and routine care needs. The patient may stop care management services at any time by phone call to the office staff.  Patient agreed to services and verbal consent obtained.   Follow up plan: Telephone appointment with care management team member scheduled for:02/07/21  [facility] Guide, Embedded Care Coordination [facility]   Care Management  Direct Dial: [phone]

## 2021-02-05 ENCOUNTER — Telehealth: Payer: Self-pay

## 2021-02-05 NOTE — Telephone Encounter (Signed)
Vincent Byrd, PT with [facility] calls nurse line requesting verbal order for new power wheelchair.   Vincent Byrd states that she just needs verbal okay from the PCP and she will then process the request.   Please advise if verbal can be provided.   Contact for Vincent Byrd is [phone].  Veronda Prude, RN

## 2021-02-07 ENCOUNTER — Telehealth: Payer: Self-pay

## 2021-02-07 ENCOUNTER — Ambulatory Visit: Payer: Medicare HMO | Admitting: Licensed Clinical Social Worker

## 2021-02-07 NOTE — Patient Instructions (Signed)
Visit Information ° °Instructions: patient will work with SW to address concerns related to transportation. ° °Patient was given the following information about care management and care coordination services today, agreed to services, and gave verbal consent: 1.care management/care coordination services include personalized support from designated clinical staff supervised by their physician, including individualized plan of care and coordination with other care providers 2. 24/7 contact phone numbers for assistance for urgent and routine care needs. 3. The patient may stop care management/care coordination services at any time by phone call to the office staff. ° °Patient verbalizes understanding of instructions provided today and agrees to view in MyChart.  ° °The care management team will reach out to the patient again over the next 30 days.  ° °Vincent Byrd, BSW  °Social Worker °[facility] Care Management  °[phone] °  ° °  °

## 2021-02-07 NOTE — Chronic Care Management (AMB) (Signed)
°  Care Management   Social Work Visit Note  02/07/2021 Name: Vincent Byrd MRN: [id number] DOB: [birth year]  Vincent Byrd is a 61 y.o. year old male who sees Vincent Settler, DO for primary care. The care management team was consulted for assistance with care management and care coordination needs related to Transportation Needs    Patient was given the following information about care management and care coordination services today, agreed to services, and gave verbal consent: 1.care management/care coordination services include personalized support from designated clinical staff supervised by their physician, including individualized plan of care and coordination with other care providers 2. 24/7 contact phone numbers for assistance for urgent and routine care needs. 3. The patient may stop care management/care coordination services at any time by phone call to the office staff.  Engaged with patient by telephone for initial visit in response to provider referral for social work chronic care management and care coordination services.  Assessment: Review of patient history, allergies, and health status during evaluation of patient need for care management/care coordination services.    Interventions:  Patient interviewed and appropriate assessments performed Collaborated with clinical team regarding patient needs  Patient currently uses [employer], but would prefer another transportation resource. SW gave patient information regarding [employer] transportation [phone]).  Patient declined counseling. Patient declines wanting to go into a nursing facility. Patient doesn't want to lose his house. Patient requested an RN to outreach him regarding pain in his left arm. Patient stated pain medication is not addressing his needs.  Patient declined needing any additional resources. Patient declined having any additional questions or concerns. Patient receives FNS Clinical biochemist), lives with  his adult son who is a major support and dog "joJo". Patient has an aid 4 days per week.  SDOH (Social Determinants of Health) assessments performed: Yes     Plan:  patient will work with BSW to address needs related to Industrial/product designer will follow up within 30 days.   Vincent Byrd, [place]  Social Worker [facility] Care Management  [phone]

## 2021-02-07 NOTE — Telephone Encounter (Signed)
Vincent Byrd [facility] PT calls nurse line requesting verbal orders to extend [facility] PT as follows.   1x a week for 4 weeks  1x a week every other week for 4 weeks  Verbal order left on secure VM to continue HH PT.

## 2021-02-07 NOTE — Telephone Encounter (Signed)
Called patient. Encouraged follow up with sports medicine prior to other referrals as they had wanted to follow up with him after MRI results.

## 2021-02-07 NOTE — Telephone Encounter (Signed)
Patient calls nurse line regarding continued issues with hand pain. Patient reports that oxycodone is not helping with this pain. Patient is requesting referral for hand specialist.   Please advise.   Veronda Prude, RN

## 2021-02-09 ENCOUNTER — Telehealth: Payer: Self-pay

## 2021-02-09 NOTE — Telephone Encounter (Signed)
Vincent Byrd [facility] PT calls nurse line recommending a referral to Orthopedics. Vincent Byrd reports his left hand is not getting better with current treatment plan. Patient has an apt on 1/16.   Will forward to PCP.

## 2021-02-12 ENCOUNTER — Other Ambulatory Visit: Payer: Self-pay | Admitting: Family Medicine

## 2021-02-12 ENCOUNTER — Ambulatory Visit: Payer: Medicare HMO

## 2021-02-12 DIAGNOSIS — I96 Gangrene, not elsewhere classified: Secondary | ICD-10-CM

## 2021-02-12 DIAGNOSIS — M79632 Pain in left forearm: Secondary | ICD-10-CM

## 2021-02-12 DIAGNOSIS — W19XXXS Unspecified fall, sequela: Secondary | ICD-10-CM

## 2021-02-12 DIAGNOSIS — M6289 Other specified disorders of muscle: Secondary | ICD-10-CM

## 2021-02-12 NOTE — Telephone Encounter (Signed)
Discussed with patient. Still in pain and does not feel that he is improving despite rehab. Would like referral to Ortho.

## 2021-02-12 NOTE — Patient Instructions (Signed)
°  Mr. Bonnin  it was nice speaking with you. Please call me directly [phone] if you have questions about the goals we discussed.   Goals Addressed   None     Patient Care Plan: RN Case Management     Problem Identified: Chronic Pain Management      Goal: The patient will manage chronic pain by using pharmacologic therapy and multimodal treatment interventions   Start Date: 02/12/2021  Expected End Date: 03/27/2021  Note:   Current Barriers:  Knowledge Deficits related to plan of care for management of Chronic Pain r/t left forearm and hand  Chronic Disease Management support and education needs related to Chronic pain r/t Left forearm and hand  RNCM Clinical Goal(s):  Patient will verbalize understanding of plan for management of Chronic Pain r/t Left forearm and hand as evidenced by following the plan the physician will provide  through collaboration with RN Care manager, provider, and care team.   Interventions: 1:1 collaboration with primary care provider regarding development and update of comprehensive plan of care as evidenced by provider attestation and co-signature Inter-disciplinary care team collaboration (see longitudinal plan of care) Evaluation of current treatment plan related to  self management and patient's adherence to plan as established by provider   Pain:  (Status: New goal.) Long Term Goal  Pain assessment performed-7/10 Medications reviewed Reviewed provider established plan for pain management; Discussed importance of adherence to all scheduled medical appointments; Discussed use of relaxation techniques and/or diversional activities to assist with pain reduction (distraction, imagery, relaxation, massage, acupressure, TENS, heat, and cold application; -the patient states that he uses heat and rubs that helps him temporarily. Reviewed with patient prescribed pharmacological and nonpharmacological pain relief strategies; careful application of heat or  ice encouraged- advised him to continue to use heat and ointments that help temporarily. enrollment in pain education program -discussed with the patient and he does not want to go to pain management.  He does not feel that will help him find out the problem pain treatment goals reviewed - The patient would like his PCP to refer him to an orthopedist to help him discover why he continues to have pain in his left forearm and hand. He is adamant that something is going on and would like an appointment. Due to cost, he does not want to be sent around to many doctors.       Mr. Bursch received Care Management services today:  Care Management services include personalized support from designated clinical staff supervised by his physician, including individualized plan of care and coordination with other care providers 24/7 contact [phone] for assistance for urgent and routine care needs. Care Management are voluntary services and be declined at any time by calling the office.  The patient verbalized understanding of instructions provided today and agreed to receive a mailed copy of patient instruction and/or educational materials.   Follow Up Plan: Patient would like continued follow-up.  CCM RNCM will outreach the patient within the next 2 weeks.  Patient will call office if needed prior to next encounter   Juanell Fairly, RN   [phone]

## 2021-02-12 NOTE — Chronic Care Management (AMB) (Addendum)
Care Management    RN Visit Note  02/12/2021 Name: Vincent Byrd MRN: [id number] DOB: January 13, 1961  Subjective: Vincent Byrd is a 61 y.o. year old male who is a primary care patient of Vincent Dick, DO. The care management team was consulted for assistance with disease management and care coordination needs.    Engaged with patient by telephone for initial visit in response to provider referral for case management and/or care coordination services.   Consent to Services:   Vincent Byrd was given information about Care Management services today including:  Care Management services includes personalized support from designated clinical staff supervised by his physician, including individualized plan of care and coordination with other care providers 24/7 contact phone numbers for assistance for urgent and routine care needs. The patient may stop case management services at any time by phone call to the office staff.  Patient agreed to services and consent obtained.    Assessment:  The patient is dealing with chronic pain in his left fore arm and hand. See Care Plan below for interventions and patient self-care actives. Follow up Plan:  2  weeks. Review of patient past medical history, allergies, medications, health status, including review of consultants reports, laboratory and other test data, was performed as part of comprehensive evaluation and provision of chronic care management services.   SDOH (Social Determinants of Health) assessments and interventions performed:    Care Plan  Allergies  Allergen Reactions   Amoxicillin     Yeast infection caused by either amoxicillin or ampicillin    Ampicillin     Yeast infection caused by either amoxicillin or ampicillin     Outpatient Encounter Medications as of 02/12/2021  Medication Sig Note   aspirin EC 81 MG tablet Take 81 mg by mouth daily. Swallow whole.    atorvastatin (LIPITOR) 40 MG tablet Take 1 tablet (40 mg  total) by mouth daily.    fluticasone (FLONASE) 50 MCG/ACT nasal spray PLACE 2 SPRAYS INTO BOTH NOSTRILS DAILY.    guaiFENesin (MUCINEX) 600 MG 12 hr tablet Take 1 tablet (600 mg total) by mouth 2 (two) times daily. 02/12/2021: Patient takes as needed   Nutritional Supplements (ENSURE ACTIVE HIGH PROTEIN) LIQD Take 16 oz by mouth 2 (two) times daily.    polyethylene glycol powder (GLYCOLAX/MIRALAX) 17 GM/SCOOP powder Take 17 g by mouth daily.    predniSONE (DELTASONE) 20 MG tablet Take 1 tablet (20 mg total) by mouth daily with breakfast.    Vitamin D, Ergocalciferol, (DRISDOL) 1.25 MG (50000 UT) CAPS capsule TAKE 1 CAPSULE EVERY 7 DAYS    aspirin 325 MG tablet Take 325 mg by mouth every other day. (Patient not taking: Reported on 02/12/2021) 11/28/2020: He is taking 81 mg daily   Baclofen 5 MG TABS Take 1 tablet by mouth 3 (three) times daily as needed. (Patient not taking: Reported on 02/01/2021)    buPROPion (WELLBUTRIN SR) 150 MG 12 hr tablet Take 1 tablet (150 mg total) by mouth 2 (two) times daily. (Patient not taking: Reported on 10/14/2020)    ibuprofen (ADVIL) 600 MG tablet Take 1 tablet (600 mg total) by mouth every 6 (six) hours as needed. (Patient not taking: Reported on 02/12/2021)    ketoconazole (NIZORAL) 2 % cream Apply 1 application topically daily. (Patient not taking: Reported on 02/12/2021)    oxyCODONE (OXY IR/ROXICODONE) 5 MG immediate release tablet Take 1 tablet (5 mg total) by mouth daily as needed for severe pain or breakthrough pain. (Patient  not taking: Reported on 02/12/2021)    zinc oxide (MEIJER ZINC OXIDE) 20 % ointment Apply 1 application topically as needed for irritation. (Patient not taking: Reported on 12/17/2019)    No facility-administered encounter medications on file as of 02/12/2021.    Patient Active Problem List   Diagnosis Date Noted   Abnormal weight loss 02/01/2021   Left forearm pain 02/01/2021   Pressure ulcer 02/01/2021   Fall 11/28/2020   Flu vaccine  need 11/28/2020   Polycythemia 04/13/2019   Encounter for smoking cessation counseling 12/04/2017   Alcohol use 04/11/2017   Tobacco abuse 11/04/2014   Anxiety 11/04/2014   Hemiparesis affecting left side as late effect of stroke (HCC) 07/07/2014    Conditions to be addressed/monitored:  Chronic pain Left fore arm and hand  Care Plan : RN Case Management  Updates made by Vincent Fairly, RN since 02/12/2021 12:00 AM     Problem: Chronic Pain Management      Goal: The patient will manage chronic pain by using pharmacologic therapy and multimodal treatment interventions   Start Date: 02/12/2021  Expected End Date: 03/27/2021  Note:   Current Barriers:  Knowledge Deficits related to plan of care for management of Chronic Pain r/t left forearm and hand  Chronic Disease Management support and education needs related to Chronic pain r/t Left forearm and hand  RNCM Clinical Goal(s):  Patient will verbalize understanding of plan for management of Chronic Pain r/t Left forearm and hand as evidenced by following the plan the physician will provide  through collaboration with RN Care manager, provider, and care team.   Interventions: 1:1 collaboration with primary care provider regarding development and update of comprehensive plan of care as evidenced by provider attestation and co-signature Inter-disciplinary care team collaboration (see longitudinal plan of care) Evaluation of current treatment plan related to  self management and patient's adherence to plan as established by provider   Pain:  (Status: New goal.) Long Term Goal  Pain assessment performed-7/10 Medications reviewed Reviewed provider established plan for pain management; Discussed importance of adherence to all scheduled medical appointments; Discussed use of relaxation techniques and/or diversional activities to assist with pain reduction (distraction, imagery, relaxation, massage, acupressure, TENS, heat, and cold  application; -the patient states that he uses heat and rubs that helps him temporarily. Reviewed with patient prescribed pharmacological and nonpharmacological pain relief strategies; careful application of heat or ice encouraged- advised him to continue to use heat and ointments that help temporarily. enrollment in pain education program -discussed with the patient and he does not want to go to pain management.  He does not feel that will help him find out the problem pain treatment goals reviewed - The patient would like his PCP to refer him to an orthopedist to help him discover why he continues to have pain in his left forearm and hand. He is adamant that something is going on and would like an appointment. Due to cost, he does not want to be sent around to many doctors.     Vincent Fairly RN, BSN, [facility] Care Management Coordinator [facility] Phone: [phone] I Fax: [phone]

## 2021-02-20 ENCOUNTER — Ambulatory Visit: Payer: Self-pay

## 2021-02-20 ENCOUNTER — Encounter: Payer: Medicare HMO | Admitting: Orthopedic Surgery

## 2021-02-20 ENCOUNTER — Encounter: Payer: Self-pay | Admitting: Orthopedic Surgery

## 2021-02-20 ENCOUNTER — Other Ambulatory Visit: Payer: Self-pay

## 2021-02-20 VITALS — BP 155/94 | HR 80 | Ht 68.75 in | Wt 141.0 lb

## 2021-02-20 DIAGNOSIS — M25532 Pain in left wrist: Secondary | ICD-10-CM

## 2021-02-20 DIAGNOSIS — M79642 Pain in left hand: Secondary | ICD-10-CM

## 2021-02-20 MED ORDER — TRAMADOL HCL 50 MG PO TABS: 50.0000 mg | ORAL_TABLET | Freq: Four times a day (QID) | ORAL | 0 refills | Status: DC | PRN

## 2021-02-21 ENCOUNTER — Telehealth: Payer: Self-pay | Admitting: Orthopedic Surgery

## 2021-02-21 NOTE — Telephone Encounter (Signed)
Patient called advised the pharmacy does not have the medication Tramadol and do not know when they will get it back in.  Patient asked if the Rx can be sent to the [facility] on [address]? Patient asked if the Rx can be called in today because his hand is hurting really bad.  The number to contact patient is [phone]

## 2021-02-22 ENCOUNTER — Other Ambulatory Visit: Payer: Self-pay | Admitting: Orthopedic Surgery

## 2021-02-22 ENCOUNTER — Telehealth: Payer: Self-pay | Admitting: Orthopedic Surgery

## 2021-02-22 MED ORDER — TRAMADOL HCL 50 MG PO TABS: 50.0000 mg | ORAL_TABLET | Freq: Four times a day (QID) | ORAL | 0 refills | Status: AC | PRN

## 2021-02-22 NOTE — Telephone Encounter (Signed)
I called and advised that meds were sent to pharm

## 2021-02-22 NOTE — Telephone Encounter (Signed)
Duplicate message---sent other message to Dr. Frazier Butt

## 2021-02-22 NOTE — Telephone Encounter (Signed)
Pt called requesting for tramadol medication refill be sent to new pharmacy. Pt states [facility] state they are out of his pain medication and not sure when it will back in stock. Pt also is asking for prednisone. Pt is asking for both medication to be sent to [facility] on [address]. Please call pt when meds have been sent it. Pt phone number is [phone].

## 2021-02-26 ENCOUNTER — Ambulatory Visit: Payer: Medicare HMO

## 2021-02-26 NOTE — Patient Instructions (Signed)
Visit Information  Mr. Delair  it was nice speaking with you. Please call me directly [phone] if you have questions about the goals we discussed.  Patient Goals/Self Care Activities: -Patient/Caregiver will self-administer medications as prescribed as evidenced by self-report/primary caregiver report  -Patient/Caregiver will attend all scheduled provider appointments as evidenced by clinician review of documented attendance to scheduled appointments and patient/caregiver report -Patient/Caregiver will call pharmacy for medication refills as evidenced by patient report and review of pharmacy fill history as appropriate -Patient/Caregiver will call provider office for new concerns or questions as evidenced by review of documented incoming telephone call notes and patient report -Patient/Caregiver verbalizes understanding of plan -Patient/Caregiver will focus on medication adherence by taking medications as prescribed -Continue physical Therapy and talk with them during their visit to better understand the situation with your hand. -Call Ortho to see if you have a follow-up appointment -Continue use of heat and rubs for temporary relief     The patient verbalized understanding of instructions, educational materials, and care plan provided today and agreed to receive a mailed copy of patient instructions, educational materials, and care plan.   Follow up Plan: Patient would like continued follow-up.  CCM RNCM will outreach the patient within the next 6 weeks.  Patient will call office if needed prior to next encounter  Juanell Fairly, RN  [phone]

## 2021-02-26 NOTE — Chronic Care Management (AMB) (Signed)
Chronic Care Management   CCM RN Visit Note  02/26/2021 Name: Vincent Byrd MRN: [id number] DOB: [birth year]  Subjective: Vincent Byrd is a 61 y.o. year old male who is a primary care patient of Sharion Settler, DO. The care management team was consulted for assistance with disease management and care coordination needs.    Engaged with patient by telephone for initial visit in response to provider referral for case management and/or care coordination services.   Consent to Services:  The patient was given the following information about Chronic Care Management services today, agreed to services, and gave verbal consent: 1. CCM service includes personalized support from designated clinical staff supervised by the primary care provider, including individualized plan of care and coordination with other care providers 2. 24/7 contact phone numbers for assistance for urgent and routine care needs. 3. Service will only be billed when office clinical staff spend 20 minutes or more in a month to coordinate care. 4. Only one practitioner may furnish and bill the service in a calendar month. 5.The patient may stop CCM services at any time (effective at the end of the month) by phone call to the office staff. 6. The patient will be responsible for cost sharing (co-pay) of up to 20% of the service fee (after annual deductible is met). Patient agreed to services and consent obtained.  Patient agreed to services and verbal consent obtained.     Summary:  The patient  continues to experience difficulty with left fore arm and hand pain.. See Care Plan below for interventions and patient self-care actives.  Recommendation: The patient may benefit from Continue physical Therapy and talk with them during their visit to better understand the situation with your hand. Call Ortho to see if you have a follow-up appointment. Continue use of heat and rubs for temporary relief.  The patient agrees.  Follow  up Plan: Patient would like continued follow-up.  CCM RNCM will outreach the patient within the next 6 weeks.  Patient will call office if needed prior to next encounter   Assessment: Review of patient past medical history, allergies, medications, health status, including review of consultants reports, laboratory and other test data, was performed as part of comprehensive evaluation and provision of chronic care management services.   SDOH (Social Determinants of Health) assessments and interventions performed:  No  CCM Care Plan   Conditions to be addressed/monitored: Pain Left Fore Arm and Hand  Care Plan : RN Case Management  Updates made by Lazaro Arms, RN since 02/26/2021 12:00 AM     Problem: Chronic Pain Management      Goal: The patient will manage chronic pain by using pharmacologic therapy and multimodal treatment interventions   Start Date: 02/12/2021  Expected End Date: 04/27/2021  Note:   Current Barriers:  Knowledge Deficits related to plan of care for management of Chronic Pain r/t left forearm and hand  Chronic Disease Management support and education needs related to Chronic pain r/t Left forearm and hand  RNCM Clinical Goal(s):  Patient will verbalize understanding of plan for management of Chronic Pain r/t Left forearm and hand as evidenced by following the plan the physician will provide  through collaboration with RN Care manager, provider, and care team.   Interventions: 1:1 collaboration with primary care provider regarding development and update of comprehensive plan of care as evidenced by provider attestation and co-signature Inter-disciplinary care team collaboration (see longitudinal plan of care) Evaluation of current treatment plan related to  self management and patient's adherence to plan as established by provider   Pain:  (Status: New goal.) Long Term Goal  Pain assessment performed-7/10 Medications reviewed Reviewed provider established plan for  pain management; Discussed importance of adherence to all scheduled medical appointments; Discussed use of relaxation techniques and/or diversional activities to assist with pain reduction (distraction, imagery, relaxation, massage, acupressure, TENS, heat, and cold application; -the patient states that he uses heat and rubs that helps him temporarily. careful application of heat or ice encouraged- advised him to continue to use heat and ointments that help temporarily. enrollment in pain education program -discussed with the patient and he does not want to go to pain management.  He does not feel that will help him find out the problem pain treatment goals reviewed - 02/26/21:  I spoke with Mr. Hillier today, and she is still having pain. He rated the pain a 7/10. During an orthopedic visit on 02/20/21, he stated he had tests and negative results, and medication was given, Tramadol 50 mg. He feels that it still is not helping. He continues to do physical therapy.    Patient Goals/Self Care Activities: -Patient/Caregiver will self-administer medications as prescribed as evidenced by self-report/primary caregiver report  -Patient/Caregiver will attend all scheduled provider appointments as evidenced by clinician review of documented attendance to scheduled appointments and patient/caregiver report -Patient/Caregiver will call pharmacy for medication refills as evidenced by patient report and review of pharmacy fill history as appropriate -Patient/Caregiver will call provider office for new concerns or questions as evidenced by review of documented incoming telephone call notes and patient report -Patient/Caregiver verbalizes understanding of plan -Patient/Caregiver will focus on medication adherence by taking medications as prescribed -Continue physical Therapy and talk with them during their visit to better understand the situation with your hand. -Call Ortho to see if you have a follow-up  appointment -Continue use of heat and rubs for temporary relief    Lazaro Arms RN, BSN, [place] Management Coordinator [facility]  Phone: [phone]

## 2021-03-06 NOTE — Progress Notes (Signed)
SUBJECTIVE:   CHIEF COMPLAINT / HPI:   Hand/Wrist Pain Persistent since his fall that occurred on October 26th. Workup thus far with x-rays of the L humerus, forearm, wrist, and hand which were negative for fracture.  Left upper extremity DVT ultrasound was also negative. He was seen by Sports Medicine and had an MRI performed which showed "extensor greater than flexor myositis in the mid to distal forearm with fairly well demarcated area of extensor muscle non-enhancement measuring approximately 3.7 x 1.6 x 5.0 cm, consistent with myonecrosis".  He has been receiving HH PT/OT and RN visits. His pain has been managed with Tylenol, Ibuprofen and Oxy IR as needed for severe and breakthrough pain. Given his continued pain, he was referred to Orthopedics. He has followed with them, unfortunately I am unable to review the notes. Patient states that he continues to have left forearm and hand pain, seems to be worse in his left hand.  He recently saw orthopedics on 1/24 and they did x-rays of his left hand and forearm.  I am unable to see their note, but he states that they were considering surgery.  He is still doing home health PT/OT but thinks his visits expire in the next week.  He states that prior to his fall in October he was driving, and felt a little bit more mobile.  His goal is to be pain-free and to get back to driving.  Weight Concerns At his last appointment on 1/5 he was concerned about his ongoing weight loss since his fall. He did have about a 20-lb weight loss when compared to November. He was provided with prescription for Ensure shakes for nutritional supplementation and shared-decision making to provide short-course of steroids. Weight on 1/24 was 141 lbs, it was difficult to obtain a weight today.  Several attempts were made.  Patient states that he does not feel like his weight has changed much since the last visit.  He feels like his appetite is starting to come back.  He has not yet  started on the protein shakes.  He would really like another course of steroids, did not feel like the last prescription was strong enough for long enough course.  He would like something to boost him up.  Feels that if he continues to lose weight he will develop pressure ulcers.  Tobacco Use Disorder Currently smoking 3/4 ppd. Has about 36 pack year history.  Scheduled for low-dose CT on 2/17.  Health Maintenance  Due for low-dose CT for lung cancer screening, colonoscopy (last colonoscopy 01/2011 with hyperplastic polyp), shingles vaccination and COVID booster.    PERTINENT  PMH / PSH: Left-sided hemiparesis s/p CVA, tobacco use disorder, history of alcohol use disorder, anxiety   OBJECTIVE:   BP 135/79    Pulse 77    SpO2 99%    General: NAD, pleasant, able to participate in exam Respiratory: Breathing comfortably on room air, normal effort, intermittent nonproductive cough Extremities: Chronic left hand contracture, wasting of left forearm muscles, decreased mobility left-sided hemiparesis Skin: Left forearm wound appears healed, skin is warm and dry Neuro: alert, awake, answers questions appropriately, intermittent involuntary laughter, unsteady gait, grossly reduced strength  ASSESSMENT/PLAN:   Left forearm pain Discussed today about realistic expectations with the pain, likely will not be able to get him to have no pain but hopefully can make it so that the pain is tolerable.  We discussed issues with opioids for pain management.  Would likely do better with neuropathic  pain management with gabapentin.  I am not able to see his recent orthopedic note, though I will plan to message the provider to see their thoughts. -Start Gabapentin 100 mg TID; can take increased dose at night but should gradually increase -Continue HH PT/OT -Move and exercise left-side often -Continue topicals -Can continue Tylenol/Ibuprofen  Protein-calorie malnutrition (HCC) Although we were unable to get a  certain weight today, his weight does appear stable from last appointment.  Encouraged him to eat at least 2 regular meals daily (ideally 3) and regular snacks in between.  Encouraged him to also supplement with protein shakes.  We thoroughly discussed how steroids are not recommended long-term for malnutrition and the harms that can be associated with them long-term.  Shared decision making to represcribe a short 5-day course of prednisone, emphasized that this would not be prescribed again in the future.  Patient agreed to work on eating more regularly, supplementing with nutrition shakes, and cutting back on smoking to help.  Tobacco abuse Unfortunately has started to smoke more recently due to his pain.  He is scheduled for low-dose CT scan next Friday 2/17.  We discussed tobacco cessation today.  He is provided with the information for the smoking hotline, as well as encouraged to schedule an appoint with Dr. Raymondo Band for further management.  At this time, he does not desire any medications, would prefer to pray on it.  Alcohol use Appears improved, has only had 1 drink in the last week.  We discussed the importance of complete alcohol cessation.  Need for shingles vaccine Counseled on common side effects.  Sent to preferred pharmacy today.     Sabino Dick, DO [facility]

## 2021-03-06 NOTE — Patient Instructions (Addendum)
It was wonderful to see you today.  Today we talked about:  -We are starting a new medication called Gabapentin. You can take this three times a day. Gradually increase the dose at night as needed.  -I am also sending a prescription for capsaicin cream that you can use as needed for pain -I am sending a short course of steroids, for the last time. The most important thing is for you to eat regular meals and snacks throughout the day, supplement with nutritional shakes, and stop smoking! -We would be happy to help you stop smoking. You can schedule an appointment with our pharmacist, Dr. Raymondo Band, who can also help. You can call 1-800-QUIT-NOW for professional help as well (free resource) -I sent in a prescription for the Shingles vaccine. You can get this at your earliest convenience. -I am sending a prescription for the shingles vaccine. You can get this at your pharmacy at your earliest convenience.  -You are due for a colonoscopy. You were last seen by Dr. Dulce Sellar with [facility]. Below is their contact information to schedule.  Address: [address] [place], [place] [address] Office Number: [phone]  Thank you for choosing [facility].   Please call [phone] with any questions about today's appointment.  Please be sure to schedule follow up at the front  desk before you leave today.   Sabino Dick, DO PGY-2 Family Medicine

## 2021-03-09 ENCOUNTER — Encounter: Payer: Self-pay | Admitting: Family Medicine

## 2021-03-09 ENCOUNTER — Ambulatory Visit (INDEPENDENT_AMBULATORY_CARE_PROVIDER_SITE_OTHER): Payer: Medicare HMO | Admitting: Family Medicine

## 2021-03-09 ENCOUNTER — Other Ambulatory Visit: Payer: Self-pay

## 2021-03-09 VITALS — BP 135/79 | HR 77

## 2021-03-09 DIAGNOSIS — Z23 Encounter for immunization: Secondary | ICD-10-CM | POA: Diagnosis not present

## 2021-03-09 DIAGNOSIS — E46 Unspecified protein-calorie malnutrition: Secondary | ICD-10-CM | POA: Insufficient documentation

## 2021-03-09 DIAGNOSIS — Z720 Tobacco use: Secondary | ICD-10-CM

## 2021-03-09 DIAGNOSIS — Z789 Other specified health status: Secondary | ICD-10-CM

## 2021-03-09 DIAGNOSIS — M79632 Pain in left forearm: Secondary | ICD-10-CM

## 2021-03-09 DIAGNOSIS — Z1211 Encounter for screening for malignant neoplasm of colon: Secondary | ICD-10-CM | POA: Diagnosis not present

## 2021-03-09 DIAGNOSIS — Z993 Dependence on wheelchair: Secondary | ICD-10-CM | POA: Diagnosis not present

## 2021-03-09 MED ORDER — ZOSTER VAC RECOMB ADJUVANTED 50 MCG/0.5ML IM SUSR: 0.5000 mL | Freq: Once | INTRAMUSCULAR | 0 refills | Status: AC

## 2021-03-09 MED ORDER — CAPSAICIN 0.05 % EX CREA: 1.0000 "application " | TOPICAL_CREAM | CUTANEOUS | 1 refills | Status: DC | PRN

## 2021-03-09 MED ORDER — PREDNISONE 20 MG PO TABS: 20.0000 mg | ORAL_TABLET | Freq: Every day | ORAL | 0 refills | Status: DC

## 2021-03-09 MED ORDER — GABAPENTIN 100 MG PO CAPS: 100.0000 mg | ORAL_CAPSULE | Freq: Three times a day (TID) | ORAL | 3 refills | Status: DC

## 2021-03-09 NOTE — Assessment & Plan Note (Signed)
Counseled on common side effects.  Sent to preferred pharmacy today.

## 2021-03-09 NOTE — Assessment & Plan Note (Signed)
Appears improved, has only had 1 drink in the last week.  We discussed the importance of complete alcohol cessation.

## 2021-03-09 NOTE — Assessment & Plan Note (Signed)
Unfortunately has started to smoke more recently due to his pain.  He is scheduled for low-dose CT scan next Friday 2/17.  We discussed tobacco cessation today.  He is provided with the information for the smoking hotline, as well as encouraged to schedule an appoint with Dr. Valentina Lucks for further management.  At this time, he does not desire any medications, would prefer to pray on it.

## 2021-03-09 NOTE — Assessment & Plan Note (Signed)
Although we were unable to get a certain weight today, his weight does appear stable from last appointment.  Encouraged him to eat at least 2 regular meals daily (ideally 3) and regular snacks in between.  Encouraged him to also supplement with protein shakes.  We thoroughly discussed how steroids are not recommended long-term for malnutrition and the harms that can be associated with them long-term.  Shared decision making to represcribe a short 5-day course of prednisone, emphasized that this would not be prescribed again in the future.  Patient agreed to work on eating more regularly, supplementing with nutrition shakes, and cutting back on smoking to help.

## 2021-03-09 NOTE — Assessment & Plan Note (Signed)
Discussed today about realistic expectations with the pain, likely will not be able to get him to have no pain but hopefully can make it so that the pain is tolerable.  We discussed issues with opioids for pain management.  Would likely do better with neuropathic pain management with gabapentin.  I am not able to see his recent orthopedic note, though I will plan to message the provider to see their thoughts. -Start Gabapentin 100 mg TID; can take increased dose at night but should gradually increase -Continue HH PT/OT -Move and exercise left-side often -Continue topicals -Can continue Tylenol/Ibuprofen

## 2021-03-16 ENCOUNTER — Ambulatory Visit (HOSPITAL_COMMUNITY): Admission: RE | Admit: 2021-03-16 | Payer: Medicare HMO | Source: Ambulatory Visit

## 2021-03-19 ENCOUNTER — Telehealth: Payer: Self-pay

## 2021-03-19 MED ORDER — CAPSAICIN 0.05 % EX CREA: 1.0000 "application " | TOPICAL_CREAM | CUTANEOUS | 1 refills | Status: DC | PRN

## 2021-03-19 NOTE — Telephone Encounter (Signed)
Patient calls nurse line reporting [facility] mail order does not carry Capsaicin Cream.  Patient requests this to be sent to [facility] on [address].   This has been resent.

## 2021-04-02 ENCOUNTER — Telehealth: Payer: Medicare HMO

## 2021-04-02 ENCOUNTER — Telehealth: Payer: Self-pay

## 2021-04-02 NOTE — Telephone Encounter (Signed)
? ?  RN Case Manager ?Care Management  ? Phone Outreach  ? ? ?04/02/2021 ?Name: NILE PRISK MRN: [id number] DOB: 04-Jul-1960 ? ?ELYE HARMSEN is a 61 y.o. year old male who is a primary care patient of Sabino Dick, DO .  ? ?Telephone outreach was unsuccessful Unable to leave a HIPPA compliant phone message due to voice mail not set up. ? ?Follow Up Plan: Will route chart to Care Guide to see if patient would like to reschedule phone appointment.   ? ?Review of patient status, including review of consultants reports, relevant laboratory and other test results, and collaboration with appropriate care team members and the patient's provider was performed as part of comprehensive patient evaluation and provision of care management services.   ? ?Juanell Fairly RN, BSN, [facility] ?Care Management Coordinator ?[facility] ?Phone: [phone] Fax: [phone] ?  ? ? ? ? ? ? ? ? ? ? ? ?

## 2021-04-16 ENCOUNTER — Telehealth: Payer: Self-pay

## 2021-04-16 NOTE — Telephone Encounter (Signed)
Patient calls nurse line to inform Dr. Nita Sells that he is mailing a form to our office (from the [place]) for assistance with getting his trash cans out to the street.  ? ?FYI to PCP.  ? ?Talbot Grumbling, RN ? ?

## 2021-04-18 ENCOUNTER — Ambulatory Visit: Payer: Medicare HMO

## 2021-04-18 NOTE — Patient Instructions (Signed)
Visit Information ? ?Mr. Font  it was nice speaking with you. Please call me directly [phone] if you have questions about the goals we discussed. ? ?Patient Goals/Self Care Activities: ?-Patient/Caregiver will self-administer medications as prescribed as evidenced by self-report/primary caregiver report  ?-Patient/Caregiver will attend all scheduled provider appointments as evidenced by clinician review of documented attendance to scheduled appointments and patient/caregiver report ?-Patient/Caregiver will call pharmacy for medication refills as evidenced by patient report and review of pharmacy fill history as appropriate ?-Patient/Caregiver will call provider office for new concerns or questions as evidenced by review of documented incoming telephone call notes and patient report ?-Patient/Caregiver verbalizes understanding of plan ?-Patient/Caregiver will focus on medication adherence by taking medications as prescribed ?-Continue use of heat and rubs for temporary relief ?-Follow up with physician about use of gabapentin ?  ? ? ?The patient verbalized understanding of instructions, educational materials, and care plan provided today and agreed to receive a mailed copy of patient instructions, educational materials, and care plan.  ? ?Follow up Plan: Patient would like continued follow-up.  CCM RNCM will outreach the patient within the next 5 weeks.  Patient will call office if needed prior to next encounter ? ?Hebah Bogosian, RN ? ?[phone]  ?

## 2021-04-18 NOTE — Chronic Care Management (AMB) (Signed)
?Chronic Care Management  ? ?CCM RN Visit Note ? ?04/18/2021 ?Name: Vincent Byrd MRN: [id number] DOB: 04/20/1960 ? ?Subjective: ?Vincent Byrd is a 61 y.o. year old male who is a primary care patient of Sharion Settler, DO. The care management team was consulted for assistance with disease management and care coordination needs.   ? ?Engaged with patient by telephone for follow up visit in response to provider referral for case management and/or care coordination services.  ? ?Consent to Services:  ?The patient was given information about Chronic Care Management services, agreed to services, and gave verbal consent prior to initiation of services.  Please see initial visit note for detailed documentation.  ? ?Patient agreed to services and verbal consent obtained.  ? ? ?Summary: Thee patient continues to maintain positive progress with care plan goals. He is taking more of his gabapentin than prescribed message sent to physician. See Care Plan below for interventions and patient self-care actives. ? ?Recommendation: The patient may benefit from taking medications as prescribed and exercising as tolerated ? ?Follow up Plan: Patient would like continued follow-up.  CCM RNCM will outreach the patient within the next 5 weeks.  Patient will call office if needed prior to next encounter ? ? ?Assessment: Review of patient past medical history, allergies, medications, health status, including review of consultants reports, laboratory and other test data, was performed as part of comprehensive evaluation and provision of chronic care management services.  ? ?SDOH (Social Determinants of Health) assessments and interventions performed:  No ? ?CCM Care Plan ?Conditions to be addressed/monitored: Left hand pain ? ?Care Plan : RN Case Management  ?Updates made by Lazaro Arms, RN since 04/18/2021 12:00 AM  ?  ? ?Problem: Chronic Pain Management   ?  ? ?Goal: The patient will manage chronic pain by using pharmacologic  therapy and multimodal treatment interventions   ?Start Date: 02/12/2021  ?Expected End Date: 06/27/2021  ?Note:   ?Current Barriers:  ?Knowledge Deficits related to plan of care for management of Chronic Pain r/t left forearm and hand  ?Chronic Disease Management support and education needs related to Chronic pain r/t Left forearm and hand ? ?RNCM Clinical Goal(s):  ?Patient will verbalize understanding of plan for management of Chronic Pain r/t Left forearm and hand as evidenced by following the plan the physician will provide  through collaboration with RN Care manager, provider, and care team.  ? ?Interventions: ?1:1 collaboration with primary care provider regarding development and update of comprehensive plan of care as evidenced by provider attestation and co-signature ?Inter-disciplinary care team collaboration (see longitudinal plan of care) ?Evaluation of current treatment plan related to  self management and patient's adherence to plan as established by provider ? ? ?Pain:  (Status: New goal.) Long Term Goal  ?Pain assessment performed-3/10 ?Medications reviewed ?Reviewed provider established plan for pain management; ?Discussed importance of adherence to all scheduled medical appointments; ?careful application of heat or ice encouraged- advised him to continue to use heat and ointments that help temporarily. ?pain treatment goals reviewed - 04/18/21:  I spoke with Mr. Swett today, and she said he is improving. He rates his hand pain at a 3/10. He is now taking gabapentin but differently than prescribed. He is taking three capsules in the morning and three capsules at bedtime. It was prescribed for one capsule three times a day. I explained that this was different than what was prescribed, but he said it helped with his hand pain. I also told him that  he would run out of his medication early. He wanted me to send you a message asking what should happen next. I sent the message to the physician. He is still  using the capsaicin cream as well. He said he has two appointments next month on the 4th of April, a consultation for his colonoscopy and on the 20th for his prostate. ?He also asked that I ask the physician if she had contacted Dr. Tempie Donning with Ortho to see what the X-ray of his hand showed because no one had contacted him. ? ? ?Patient Goals/Self Care Activities: ?-Patient/Caregiver will self-administer medications as prescribed as evidenced by self-report/primary caregiver report  ?-Patient/Caregiver will attend all scheduled provider appointments as evidenced by clinician review of documented attendance to scheduled appointments and patient/caregiver report ?-Patient/Caregiver will call pharmacy for medication refills as evidenced by patient report and review of pharmacy fill history as appropriate ?-Patient/Caregiver will call provider office for new concerns or questions as evidenced by review of documented incoming telephone call notes and patient report ?-Patient/Caregiver verbalizes understanding of plan ?-Patient/Caregiver will focus on medication adherence by taking medications as prescribed ?-Continue use of heat and rubs for temporary relief ?-Follow up with physician about use of gabapentin ?  ? ?Lazaro Arms RN, BSN, [place] ?Care Management Coordinator ?[place]  ?Phone: [phone]  ?  ? ? ? ? ? ? ? ? ?

## 2021-04-25 ENCOUNTER — Encounter: Payer: Self-pay | Admitting: Family Medicine

## 2021-04-26 NOTE — Telephone Encounter (Signed)
Patient calls nurse line checking the status of forms.  ? ?Advised patient our mail takes a longer than normal time to process.  ? ?Will forward to PCP for update.  ?

## 2021-04-27 ENCOUNTER — Telehealth: Payer: Self-pay

## 2021-04-27 NOTE — Telephone Encounter (Signed)
Patient has been updated.

## 2021-04-27 NOTE — Telephone Encounter (Signed)
Patient calls nurse line reporting dose change in Gabapentin.  ? ?Patient reports he was prescribed 100mg  TID, however that was not controlling his pain.  ? ?Patient reports he has been taking 300mg  at once with relief. Patient reports he is only needing to do this one time per day.  ? ?Patient requesting a new prescription to reflect change.  ? ?Advised patient as long as he does not exceed #3 per day the prescription in place now should be fine.  ? ?Patient advised to FU for pain management soon.  ?

## 2021-05-17 DIAGNOSIS — R972 Elevated prostate specific antigen [PSA]: Secondary | ICD-10-CM | POA: Diagnosis not present

## 2021-05-17 DIAGNOSIS — Z125 Encounter for screening for malignant neoplasm of prostate: Secondary | ICD-10-CM | POA: Diagnosis not present

## 2021-05-17 DIAGNOSIS — N5201 Erectile dysfunction due to arterial insufficiency: Secondary | ICD-10-CM | POA: Diagnosis not present

## 2021-05-17 DIAGNOSIS — N401 Enlarged prostate with lower urinary tract symptoms: Secondary | ICD-10-CM | POA: Diagnosis not present

## 2021-05-17 DIAGNOSIS — R350 Frequency of micturition: Secondary | ICD-10-CM | POA: Diagnosis not present

## 2021-05-22 ENCOUNTER — Ambulatory Visit: Payer: Medicare HMO

## 2021-05-23 NOTE — Chronic Care Management (AMB) (Signed)
?Chronic Care Management  ? ?CCM RN Visit Note ? ?05/23/2021 ?Name: Vincent Byrd MRN: [id number] DOB: 12-18-1960 ? ?Subjective: ?Vincent Byrd is a 61 y.o. year old male who is a primary care patient of Vincent Dick, DO. The care management team was consulted for assistance with disease management and care coordination needs.   ? ?Engaged with patient by telephone for follow up visit in response to provider referral for case management and/or care coordination services.  ? ?Consent to Services:  ?The patient was given information about Chronic Care Management services, agreed to services, and gave verbal consent prior to initiation of services.  Please see initial visit note for detailed documentation.  ? ?Patient agreed to services and verbal consent obtained.  ? ? ?Summary:  The patient continues to maintain positive progress with care plan goals. See Care Plan below for interventions and patient self-care actives. ? ?Recommendation: The patient may benefit from taking medications as prescribed, exercising as tolerated, and The patient agrees. ? ?Follow up Plan: Patient would like continued follow-up.  CCM RNCM will outreach the patient within the next 5 weeks.  Patient will call office if needed prior to next encounter ? ? ?Assessment: Review of patient past medical history, allergies, medications, health status, including review of consultants reports, laboratory and other test data, was performed as part of comprehensive evaluation and provision of chronic care management services.  ? ?SDOH (Social Determinants of Health) assessments and interventions performed:  No ? ?CCM Care Plan ? ? ? ? ?Conditions to be addressed/monitored: Left Hand Pain ? ?Care Plan : RN Case Management  ?Updates made by Vincent Fairly, RN since 05/23/2021 12:00 AM  ?  ? ?Problem: Chronic Pain Management   ?  ? ?Goal: The patient will manage chronic pain by using pharmacologic therapy and multimodal treatment interventions    ?Start Date: 02/12/2021  ?Expected End Date: 07/27/2021  ?Note:   ?Current Barriers:  ?Knowledge Deficits related to plan of care for management of Chronic Pain r/t left forearm and hand  ?Chronic Disease Management support and education needs related to Chronic pain r/t Left forearm and hand ? ?RNCM Clinical Goal(s):  ?Patient will verbalize understanding of plan for management of Chronic Pain r/t Left forearm and hand as evidenced by following the plan the physician will provide  through collaboration with RN Care manager, provider, and care team.  ? ?Interventions: ?1:1 collaboration with primary care provider regarding development and update of comprehensive plan of care as evidenced by provider attestation and co-signature ?Inter-disciplinary care team collaboration (see longitudinal plan of care) ?Evaluation of current treatment plan related to  self management and patient's adherence to plan as established by provider ? ? ?Pain:  (Status: New goal.) Long Term Goal  ?Pain assessment performed-3/10 ?Medications reviewed ?Reviewed provider established plan for pain management; ?Discussed importance of adherence to all scheduled medical appointments; ?careful application of heat or ice encouraged- advised him to continue to use heat and ointments that help temporarily. ?pain treatment goals reviewed - 05/22/21:  Mr. Sanor is doing well. He said the pain had moved from his left arm and wrist into his fingers. He is trying to do some exercises and the baclofen and gabapentin help. He rates the pain at a 3/10. He wanted an appointment for a check-up and blood work, so I helped him get an office visit with his PCP on 06/06/21 at 1050 am. ? ? ?Patient Goals/Self Care Activities: ?-Patient/Caregiver will self-administer medications as prescribed as evidenced by  self-report/primary caregiver report  ?-Patient/Caregiver will attend all scheduled provider appointments as evidenced by clinician review of documented  attendance to scheduled appointments and patient/caregiver report ?-Patient/Caregiver will call pharmacy for medication refills as evidenced by patient report and review of pharmacy fill history as appropriate ?-Patient/Caregiver will call provider office for new concerns or questions as evidenced by review of documented incoming telephone call notes and patient report ?-Patient/Caregiver verbalizes understanding of plan ?-Patient/Caregiver will focus on medication adherence by taking medications as prescribed ?-Continue use of heat and rubs for temporary relief ?-Follow up with physician about use of gabapentin ?  ? ?Vincent Fairly RN, BSN, C[facility] ?Care Management Coordinator ?[facility]  ?Phone: [phone]  ?  ? ? ? ? ? ? ? ?

## 2021-05-23 NOTE — Patient Instructions (Signed)
Visit Information ? ?Mr. Whidbee  it was nice speaking with you. Please call me directly [phone] if you have questions about the goals we discussed. ? ?Patient Goals/Self Care Activities: ?-Patient/Caregiver will self-administer medications as prescribed as evidenced by self-report/primary caregiver report  ?-Patient/Caregiver will attend all scheduled provider appointments as evidenced by clinician review of documented attendance to scheduled appointments and patient/caregiver report ?-Patient/Caregiver will call pharmacy for medication refills as evidenced by patient report and review of pharmacy fill history as appropriate ?-Patient/Caregiver will call provider office for new concerns or questions as evidenced by review of documented incoming telephone call notes and patient report ?-Patient/Caregiver verbalizes understanding of plan ?-Patient/Caregiver will focus on medication adherence by taking medications as prescribed ?-Continue use of heat and rubs for temporary relief ?-Follow up with physician about use of gabapentin ?  ? ? ?The patient verbalized understanding of instructions, educational materials, and care plan provided today and agreed to receive a mailed copy of patient instructions, educational materials, and care plan.  ? ?Follow up Plan: Patient would like continued follow-up.  CCM RNCM will outreach the patient within the next 5 weeks.  Patient will call office if needed prior to next encounter ? ?Juanell Fairly, RN ? ?[phone]  ?

## 2021-06-04 NOTE — Progress Notes (Signed)
? ? ?  SUBJECTIVE:  ? ?Chief compliant/HPI: annual examination ? ?Vincent Byrd is a 61 y.o. who presents today for an annual exam.  ? ?Smoking 3/4 pack for the last 25-30 years. ? ?History tabs reviewed and updated.  ? ?Review of systems form reviewed and notable for +cough with phlegm (using mucinex which helps).  ? ?OBJECTIVE:  ? ?BP 132/75   Pulse 60   Wt 155 lb (70.3 kg)   SpO2 96%   BMI 23.06 kg/m?   ?General: Awake, alert, oriented, in no acute distress, pleasant and cooperative with examination ?HEENT: Normocephalic, atraumatic, nares patent ?Cardio: RRR without murmur, 2+ radial, DP and PT pulses b/l ?Respiratory: Expiratory wheezing lower bases, coarse crackles b/l upper lungs ?MSK: left-sided hemiparesis ?Extremities: without edema or cyanosis ?Neuro: Speech is clear and intact, wheelchair bound, left-sided hemiparesis ?Psych: Normal mood and affect ? ? ?  06/06/2021  ? 10:58 AM 03/09/2021  ?  9:01 AM 12/15/2020  ?  8:52 AM  ?Depression screen PHQ 2/9  ?Decreased Interest 0 3 3  ?Down, Depressed, Hopeless 0 0 0  ?PHQ - 2 Score 0 3 3  ?Altered sleeping 0 0 0  ?Tired, decreased energy 0 0 0  ?Change in appetite 0 2 0  ?Feeling bad or failure about yourself  0 0 0  ?Trouble concentrating 0 0 0  ?Moving slowly or fidgety/restless 0 0 0  ?Suicidal thoughts 0 0 0  ?PHQ-9 Score 0 5 3  ?Difficult doing work/chores Not difficult at all    ? ? ? ?ASSESSMENT/PLAN:  ? ?Protein-calorie malnutrition ([place]) ?Weight is up 14 lbs since January. He should continue on nutritional supplements as needed.  ?-CMP  ? ?Polycythemia ?History of polycythemia and leukopenia on most recent CBC. Likely a result of tobacco use. ?-Repeat CBC  ? ?Tobacco abuse ?Still not quite interested in cessation. Still has Wellbutrin at home but is not taking it. Had some expiratory wheezing in lower bases, no diagnosis of COPD/asthma. SpO2 within normal limits. Scheduled with Dr. Valentina Lucks for PFTs ?-Will continue to discuss cessation at follow ups   ?-PFTs ?-Low dose CT ordered today. Will need to be scheduled for sometime in June (per patients preference) ?  ? ?Annual Examination  ?See AVS for age appropriate recommendations.  ?PHQ score 0, reviewed and discussed.  ?Blood pressure value is goal, discussed.  ? ?Considered the following screening exams based upon USPSTF recommendations: ?Diabetes screening: discussed and not ordered ?Screening for elevated cholesterol: discussed and ordered ?HIV testing: discussed and ordered ?Hepatitis C: discussed and ordered ?Hepatitis B:  low-risk, not ordered ?Syphilis if at high risk:  low risk, not ordered ?Reviewed risk factors for latent tuberculosis and not indicated ?Colorectal cancer screening:  scheduled in July  ?Lung cancer screening:  previously ordered, will need to be scheduled. Elects to wait until his son is out of school in June to take him.  ?PSA discussed and after engaging in discussion of possible risks, benefits and complications of screening patient states he has a Urology appointment tomorrow for this. ? ?Follow up in 1 year or sooner if indicated.  ? ? ?Sharion Settler, DO ?[place]  ? ?

## 2021-06-06 ENCOUNTER — Encounter: Payer: Self-pay | Admitting: Family Medicine

## 2021-06-06 ENCOUNTER — Ambulatory Visit (INDEPENDENT_AMBULATORY_CARE_PROVIDER_SITE_OTHER): Payer: Medicare HMO | Admitting: Family Medicine

## 2021-06-06 VITALS — BP 132/75 | HR 60 | Wt 155.0 lb

## 2021-06-06 DIAGNOSIS — D72819 Decreased white blood cell count, unspecified: Secondary | ICD-10-CM

## 2021-06-06 DIAGNOSIS — E46 Unspecified protein-calorie malnutrition: Secondary | ICD-10-CM

## 2021-06-06 DIAGNOSIS — Z13220 Encounter for screening for lipoid disorders: Secondary | ICD-10-CM

## 2021-06-06 DIAGNOSIS — D751 Secondary polycythemia: Secondary | ICD-10-CM | POA: Diagnosis not present

## 2021-06-06 DIAGNOSIS — Z122 Encounter for screening for malignant neoplasm of respiratory organs: Secondary | ICD-10-CM | POA: Diagnosis not present

## 2021-06-06 DIAGNOSIS — Z114 Encounter for screening for human immunodeficiency virus [HIV]: Secondary | ICD-10-CM | POA: Diagnosis not present

## 2021-06-06 DIAGNOSIS — Z1159 Encounter for screening for other viral diseases: Secondary | ICD-10-CM | POA: Diagnosis not present

## 2021-06-06 DIAGNOSIS — Z720 Tobacco use: Secondary | ICD-10-CM | POA: Diagnosis not present

## 2021-06-06 NOTE — Assessment & Plan Note (Addendum)
Still not quite interested in cessation. Still has Wellbutrin at home but is not taking it. Had some expiratory wheezing in lower bases, no diagnosis of COPD/asthma. SpO2 within normal limits. Scheduled with Dr. Valentina Lucks for PFTs ?-Will continue to discuss cessation at follow ups  ?-PFTs ?-Low dose CT ordered today. Will need to be scheduled for sometime in June (per patients preference) ?

## 2021-06-06 NOTE — Assessment & Plan Note (Signed)
History of polycythemia and leukopenia on most recent CBC. Likely a result of tobacco use. ?-Repeat CBC  ?

## 2021-06-06 NOTE — Assessment & Plan Note (Signed)
Weight is up 14 lbs since January. He should continue on nutritional supplements as needed.  ?-CMP  ?

## 2021-06-06 NOTE — Patient Instructions (Addendum)
It was wonderful to see you today. ? ?Please bring ALL of your medications with you to every visit.  ? ?Today we talked about: ? ?-We are doing lab work today to check your complete blood count, cholesterol and screening for HIV and Hepatitis C. I will send you a MyChart message if you have MyChart. Otherwise, I will give you a call for abnormal results or send a letter if everything returned back normal. If you don't hear from me in 2 weeks, please call the office.   ?-Continue taking nutritional supplements. ?-We will try and work on scheduling you for your lung cancer screening in June ?-I have scheduled you with our pharmacist for lung function testing. ? ?Thank you for choosing [facility].  ? ?Please call [phone] with any questions about today's appointment. ? ?Please be sure to schedule follow up at the front  desk before you leave today.  ? ?Sabino Dick, DO ?PGY-2 Family Medicine   ?

## 2021-06-07 LAB — COMPREHENSIVE METABOLIC PANEL
ALT: 12 IU/L (ref 0–44)
AST: 15 IU/L (ref 0–40)
Albumin/Globulin Ratio: 1.9 (ref 1.2–2.2)
Albumin: 4.4 g/dL (ref 3.8–4.8)
Alkaline Phosphatase: 52 IU/L (ref 44–121)
BUN/Creatinine Ratio: 8 — ABNORMAL LOW (ref 10–24)
BUN: 6 mg/dL — ABNORMAL LOW (ref 8–27)
Bilirubin Total: 0.6 mg/dL (ref 0.0–1.2)
CO2: 24 mmol/L (ref 20–29)
Calcium: 9.9 mg/dL (ref 8.6–10.2)
Chloride: 104 mmol/L (ref 96–106)
Creatinine, Ser: 0.79 mg/dL (ref 0.76–1.27)
Globulin, Total: 2.3 g/dL (ref 1.5–4.5)
Glucose: 85 mg/dL (ref 70–99)
Potassium: 4.1 mmol/L (ref 3.5–5.2)
Sodium: 142 mmol/L (ref 134–144)
Total Protein: 6.7 g/dL (ref 6.0–8.5)
eGFR: 101 mL/min/{1.73_m2} (ref >59)

## 2021-06-07 LAB — CBC WITH DIFFERENTIAL/PLATELET
Basophils Absolute: 0 10*3/uL (ref 0.0–0.2)
Basos: 1 %
EOS (ABSOLUTE): 0 10*3/uL (ref 0.0–0.4)
Eos: 1 %
Hematocrit: 49.3 % (ref 37.5–51.0)
Hemoglobin: 16.8 g/dL (ref 13.0–17.7)
Immature Grans (Abs): 0 10*3/uL (ref 0.0–0.1)
Immature Granulocytes: 0 %
Lymphocytes Absolute: 1.5 10*3/uL (ref 0.7–3.1)
Lymphs: 44 %
MCH: 30.3 pg (ref 26.6–33.0)
MCHC: 34.1 g/dL (ref 31.5–35.7)
MCV: 89 fL (ref 79–97)
Monocytes Absolute: 0.3 10*3/uL (ref 0.1–0.9)
Monocytes: 9 %
Neutrophils Absolute: 1.6 10*3/uL (ref 1.4–7.0)
Neutrophils: 45 %
Platelets: 186 10*3/uL (ref 150–450)
RBC: 5.55 x10E6/uL (ref 4.14–5.80)
RDW: 13.2 % (ref 11.6–15.4)
WBC: 3.5 10*3/uL (ref 3.4–10.8)

## 2021-06-07 LAB — HIV ANTIBODY (ROUTINE TESTING W REFLEX): HIV Screen 4th Generation wRfx: NONREACTIVE

## 2021-06-07 LAB — LIPID PANEL
Chol/HDL Ratio: 1.9 ratio (ref 0.0–5.0)
Cholesterol, Total: 103 mg/dL (ref 100–199)
HDL: 55 mg/dL (ref >39)
LDL Chol Calc (NIH): 36 mg/dL (ref 0–99)
Triglycerides: 50 mg/dL (ref 0–149)
VLDL Cholesterol Cal: 12 mg/dL (ref 5–40)

## 2021-06-07 LAB — HCV AB W REFLEX TO QUANT PCR: HCV Ab: NONREACTIVE

## 2021-06-07 LAB — HCV INTERPRETATION

## 2021-06-11 ENCOUNTER — Telehealth: Payer: Self-pay

## 2021-06-11 ENCOUNTER — Ambulatory Visit: Payer: Medicare HMO

## 2021-06-11 NOTE — Telephone Encounter (Signed)
Informed patient of upcoming appointment. ? ?06/18/2021 ?Ct Chest ?1100 with arrival @ 1030 ?[place] ? ?Patient was given number for rescheduling if needed.  [facility] stated that they had no one to assist patient on exam table.   ?Vincent Byrd states that they have no problem assisting him.  Patient will call to verify but will reschedule if need be to when his son is available to assist him. ? ?.Glennie Hawk, CMA ? ?

## 2021-06-11 NOTE — Patient Instructions (Signed)
Visit Information ? ?Mr. Stapel  it was nice speaking with you. Please call me directly [phone] if you have questions about the goals we discussed. ? ?Patient Goals/Self Care Activities: ?-Patient/Caregiver will self-administer medications as prescribed as evidenced by self-report/primary caregiver report  ?-Patient/Caregiver will attend all scheduled provider appointments as evidenced by clinician review of documented attendance to scheduled appointments and patient/caregiver report ?-Patient/Caregiver will call pharmacy for medication refills as evidenced by patient report and review of pharmacy fill history as appropriate ?-Patient/Caregiver will call provider office for new concerns or questions as evidenced by review of documented incoming telephone call notes and patient report ?-Patient/Caregiver verbalizes understanding of plan ?-Patient/Caregiver will focus on medication adherence by taking medications as prescribed ?-Continue use of heat and rubs for temporary relief ?  ? ? ?The patient verbalized understanding of instructions, educational materials, and care plan provided today and agreed to receive a mailed copy of patient instructions, educational materials, and care plan.  ? ?Follow up Plan: Patient would like continued follow-up.  CCM RNCM will outreach the patient within the next 5 weeks.  Patient will call office if needed prior to next encounter ? ?Juanell Fairly, RN ? ?[phone]  ?

## 2021-06-11 NOTE — Chronic Care Management (AMB) (Signed)
?Chronic Care Management  ? ?CCM RN Visit Note ? ?06/11/2021 ?Name: Vincent Byrd MRN: [id number] DOB: 1960/08/05 ? ?Subjective: ?Vincent Byrd is a 61 y.o. year old male who is a primary care patient of Sabino Dick, DO. The care management team was consulted for assistance with disease management and care coordination needs.   ? ?Engaged with patient by telephone for follow up visit in response to provider referral for case management and/or care coordination services.  ? ?Consent to Services:  ?The patient was given information about Chronic Care Management services, agreed to services, and gave verbal consent prior to initiation of services.  Please see initial visit note for detailed documentation.  ? ?Patient agreed to services and verbal consent obtained.  ? ? ?Summary: Patient was not interviewed or contacted during this encounter.   . See Care Plan below for interventions and patient self-care actives. ? ?Recommendation: The patient may benefit from taking medications as prescribed, monitoring food intake, Use his supplemental drinks, and The patient agrees. ? ?Follow up Plan: Patient would like continued follow-up.  CCM RNCM will outreach the patient within the next 5 weeks.  Patient will call office if needed prior to next encounter ? ? ?Assessment: Review of patient past medical history, allergies, medications, health status, including review of consultants reports, laboratory and other test data, was performed as part of comprehensive evaluation and provision of chronic care management services.  ? ?SDOH (Social Determinants of Health) assessments and interventions performed:   ? ?CCM Care Plan ?Conditions to be addressed/monitored: Left Hand Pain ? ?Care Plan : RN Case Management  ?Updates made by Juanell Fairly, RN since 06/11/2021 12:00 AM  ?  ? ?Problem: Chronic Pain Management   ?  ? ?Goal: The patient will manage chronic pain by using pharmacologic therapy and multimodal treatment  interventions   ?Start Date: 02/12/2021  ?Expected End Date: 07/27/2021  ?Note:   ?Current Barriers:  ?Knowledge Deficits related to plan of care for management of Chronic Pain r/t left forearm and hand  ?Chronic Disease Management support and education needs related to Chronic pain r/t Left forearm and hand ? ?RNCM Clinical Goal(s):  ?Patient will verbalize understanding of plan for management of Chronic Pain r/t Left forearm and hand as evidenced by following the plan the physician will provide  through collaboration with RN Care manager, provider, and care team.  ? ?Interventions: ?1:1 collaboration with primary care provider regarding development and update of comprehensive plan of care as evidenced by provider attestation and co-signature ?Inter-disciplinary care team collaboration (see longitudinal plan of care) ?Evaluation of current treatment plan related to  self management and patient's adherence to plan as established by provider ? ? ?Pain:  (Status: New goal.) Long Term Goal  ?Pain assessment performed-2/10 ?Medications reviewed ?Reviewed provider established plan for pain management; ?Discussed importance of adherence to all scheduled medical appointments; ?careful application of heat or ice encouraged-. ?pain treatment goals reviewed - 05/1521:  The patient has reported experiencing some pain in his left hand, which has now spread to his fingertips. He has rated the pain as minimal, with a level of 2 out of 10. During his annual exam, his PCP reviewed his blood work and found no issues. However, the patient has gained 14 lbs and continues to smoke with no plans to quit. He ordered a brace for his arm through his insurance catalog, but there was a mix-up with his appointment at [facility], and he needs to reschedule. He has a  colonoscopy appointment in June and will undergo a PFT due to expiratory wheezing in his lower lung bases. ? ? ?Patient Goals/Self Care Activities: ?-Patient/Caregiver will  self-administer medications as prescribed as evidenced by self-report/primary caregiver report  ?-Patient/Caregiver will attend all scheduled provider appointments as evidenced by clinician review of documented attendance to scheduled appointments and patient/caregiver report ?-Patient/Caregiver will call pharmacy for medication refills as evidenced by patient report and review of pharmacy fill history as appropriate ?-Patient/Caregiver will call provider office for new concerns or questions as evidenced by review of documented incoming telephone call notes and patient report ?-Patient/Caregiver verbalizes understanding of plan ?-Patient/Caregiver will focus on medication adherence by taking medications as prescribed ?-Continue use of heat and rubs for temporary relief ? ?  ? ? ?Juanell Fairly RN, BSN, CPC ?Care Management Coordinator ?[facility]  ?Phone: [phone]  ?  ? ? ? ? ? ? ? ? ?

## 2021-06-18 ENCOUNTER — Ambulatory Visit (HOSPITAL_COMMUNITY): Payer: Medicare HMO

## 2021-06-27 ENCOUNTER — Other Ambulatory Visit: Payer: Self-pay | Admitting: Family Medicine

## 2021-06-27 ENCOUNTER — Telehealth: Payer: Self-pay | Admitting: *Deleted

## 2021-06-27 DIAGNOSIS — Z993 Dependence on wheelchair: Secondary | ICD-10-CM

## 2021-06-27 DIAGNOSIS — I69354 Hemiplegia and hemiparesis following cerebral infarction affecting left non-dominant side: Secondary | ICD-10-CM

## 2021-06-27 NOTE — Telephone Encounter (Signed)
Patient reports that his electric wheelchair is broken ( thinks it is something to do with the joystick).  He needs an order placed and sent to [employer] to evaluate and repair his wheelchair.  Dr. Melba Coon,  If you will place the order I will contact [employer] and get it started.  Jone Baseman, CMA

## 2021-06-27 NOTE — Telephone Encounter (Signed)
Received message from [employer].  They will process request now. Jone Baseman, CMA

## 2021-06-27 NOTE — Telephone Encounter (Signed)
Community message sent to [employer], [place], Stephannie Peters and Nash Shearer @ [facility] to process DME order for evaluate and repair wheelchair.   Christen Bame, CMA

## 2021-06-27 NOTE — Addendum Note (Signed)
Addended by: Christen Bame D on: 06/27/2021 09:59 AM   Modules accepted: Orders

## 2021-07-02 ENCOUNTER — Ambulatory Visit: Payer: Medicare HMO | Admitting: Pharmacist

## 2021-07-06 ENCOUNTER — Telehealth: Payer: Self-pay

## 2021-07-06 NOTE — Telephone Encounter (Signed)
Patient calls nurse line x3 in regards to wheelchair order.   Patient reports [employer] will be faxing over a document in regards to wheelchair.   Please have an attending sign off on this.   Please let me know if you have not received document.

## 2021-07-09 NOTE — Telephone Encounter (Signed)
Received it today and given to attending to sign.

## 2021-07-12 ENCOUNTER — Ambulatory Visit: Payer: Medicare HMO

## 2021-07-12 NOTE — Telephone Encounter (Signed)
Patient returns call to nurse line regarding paperwork. Reports that [employer] has not yet received paperwork.   Verified correct fax number. Form given to Shae to attach medical records from last two office visits as indicated on paperwork and refax to [employer].   Veronda Prude, RN

## 2021-07-13 NOTE — Patient Instructions (Addendum)
Visit Information  Mr. Kidney  it was nice speaking with you. Please call me directly [phone] if you have questions about the goals we discussed.  [name] you have successfully completed your goals.  If was a pleasure working with you.  I hope that you continue to make great strides with your health.  Follow up Plan: If further intervention is needed the care management team is available to follow up after a formal CCM referral is placed  and No follow up scheduled with CCM team at this time  Juanell Fairly, RN [phone]

## 2021-07-13 NOTE — Chronic Care Management (AMB) (Signed)
Chronic Care Management   CCM RN Visit Note  07/12/2021 Name: Vincent Byrd MRN: [id number] DOB: 1960-05-26  Subjective: Vincent Byrd is a 61 y.o. year old male who is a primary care patient of Sharion Settler, DO. The care management team was consulted for assistance with disease management and care coordination needs.    Engaged with patient by telephone for follow up visit in response to provider referral for case management and/or care coordination services.   Consent to Services:  The patient was given information about Chronic Care Management services, agreed to services, and gave verbal consent prior to initiation of services.  Please see initial visit note for detailed documentation.   Patient agreed to services and verbal consent obtained.    Summary:  The patient has met his goal by no longer complaining of pain in his hand . See Care Plan below for interventions and patient self-care actives.  Recommendation: The patient may benefit from taking medications as prescribed, He will continue to exercise his hand and wrist to retain the benefits of no pain*, and The patient agrees.  Follow up Plan: If further intervention is needed the care management team is available to follow up after a formal CCM referral is placed  and No follow up scheduled with CCM team at this time   Assessment: Review of patient past medical history, allergies, medications, health status, including review of consultants reports, laboratory and other test data, was performed as part of comprehensive evaluation and provision of chronic care management services.   SDOH (Social Determinants of Health) assessments and interventions performed:    CCM Care Plan  Allergies  Allergen Reactions   Amoxicillin     Yeast infection caused by either amoxicillin or ampicillin    Ampicillin     Yeast infection caused by either amoxicillin or ampicillin     Outpatient Encounter Medications as of  07/12/2021  Medication Sig Note   aspirin EC 81 MG tablet Take 81 mg by mouth daily. Swallow whole.    atorvastatin (LIPITOR) 40 MG tablet Take 1 tablet (40 mg total) by mouth daily.    Baclofen 5 MG TABS Take 1 tablet by mouth 3 (three) times daily as needed.    buPROPion (WELLBUTRIN SR) 150 MG 12 hr tablet Take 1 tablet (150 mg total) by mouth 2 (two) times daily. (Patient not taking: Reported on 10/14/2020)    Capsaicin 0.05 % CREA Apply 1 application topically every 4 (four) hours as needed (Hand pain).    fluticasone (FLONASE) 50 MCG/ACT nasal spray PLACE 2 SPRAYS INTO BOTH NOSTRILS DAILY. 03/09/2021: Takes as needed   gabapentin (NEURONTIN) 100 MG capsule Take 1 capsule (100 mg total) by mouth 3 (three) times daily.    guaiFENesin (MUCINEX) 600 MG 12 hr tablet Take 1 tablet (600 mg total) by mouth 2 (two) times daily. 02/12/2021: Patient takes as needed   ibuprofen (ADVIL) 600 MG tablet Take 1 tablet (600 mg total) by mouth every 6 (six) hours as needed. (Patient not taking: Reported on 06/06/2021)    ketoconazole (NIZORAL) 2 % cream Apply 1 application topically daily. (Patient not taking: Reported on 06/06/2021)    Nutritional Supplements (ENSURE ACTIVE HIGH PROTEIN) LIQD Take 16 oz by mouth 2 (two) times daily. (Patient not taking: Reported on 03/09/2021)    polyethylene glycol powder (GLYCOLAX/MIRALAX) 17 GM/SCOOP powder Take 17 g by mouth daily. (Patient not taking: Reported on 03/09/2021)    predniSONE (DELTASONE) 20 MG tablet Take 1 tablet (20  mg total) by mouth daily with breakfast. (Patient not taking: Reported on 06/06/2021)    Vitamin D, Ergocalciferol, (DRISDOL) 1.25 MG (50000 UT) CAPS capsule TAKE 1 CAPSULE EVERY 7 DAYS    zinc oxide (MEIJER ZINC OXIDE) 20 % ointment Apply 1 application topically as needed for irritation. (Patient not taking: Reported on 12/17/2019)    No facility-administered encounter medications on file as of 07/12/2021.    Patient Active Problem List   Diagnosis  Date Noted   Wheelchair dependence 03/09/2021   Protein-calorie malnutrition ([place]) 03/09/2021   Need for shingles vaccine 03/09/2021   Abnormal weight loss 02/01/2021   Left forearm pain 02/01/2021   Pressure ulcer 02/01/2021   Fall 11/28/2020   Flu vaccine need 11/28/2020   Polycythemia 04/13/2019   Encounter for smoking cessation counseling 12/04/2017   Alcohol use 04/11/2017   Tobacco abuse 11/04/2014   Anxiety 11/04/2014   Hemiparesis affecting left side as late effect of stroke ([place]) 07/07/2014    Conditions to be addressed/monitored: Left Hand Pain  Care Plan : RN Case Management  Updates made by Lazaro Arms, RN since 07/13/2021 12:00 AM     Problem: Chronic Pain Management      Goal: The patient will manage chronic pain by using pharmacologic therapy and multimodal treatment interventions Completed 07/12/2021  Start Date: 02/12/2021  Expected End Date: 07/27/2021  Note:   Current Barriers:  Knowledge Deficits related to plan of care for management of Chronic Pain r/t left forearm and hand  Chronic Disease Management support and education needs related to Chronic pain r/t Left forearm and hand  RNCM Clinical Goal(s):  Patient will verbalize understanding of plan for management of Chronic Pain r/t Left forearm and hand as evidenced by following the plan the physician will provide  through collaboration with RN Care manager, provider, and care team.   Interventions: 1:1 collaboration with primary care provider regarding development and update of comprehensive plan of care as evidenced by provider attestation and co-signature Inter-disciplinary care team collaboration (see longitudinal plan of care) Evaluation of current treatment plan related to  self management and patient's adherence to plan as established by provider   Pain:  (Status: New goal.) Long Term Goal  Pain assessment performed-2/10 Medications reviewed Reviewed provider established plan for pain  management; Discussed importance of adherence to all scheduled medical appointments; careful application of heat or ice encouraged-. pain treatment goals reviewed - 07/12/21: I had a conversation with Mr. Lincoln, and he is doing well. He informed me that he is still not experiencing pain in his hand or fingers, except for a slight tingling sensation in his fingertips. Unfortunately, his wheelchair had broken down, causing him to miss some appointments. However, the issue has been addressed, and his appointments have been rescheduled. His son will be accompanying him to his colonoscopy appointment. Lyndal Rainbow will visit him to discuss possibly getting a new wheelchair since the current wheelchair cannot be repaired. Mr. Karner has achieved his goal, and we agreed that I could discontinue following up with him. Nonetheless, I advised him to inform his provider if he encounters any problems in the future, and I would be more than happy to help him. He expressed his appreciation for all the assistance.  Patient Goals/Self Care Activities: -Patient/Caregiver will self-administer medications as prescribed as evidenced by self-report/primary caregiver report  -Patient/Caregiver will attend all scheduled provider appointments as evidenced by clinician review of documented attendance to scheduled appointments and patient/caregiver report -Patient/Caregiver will call pharmacy for medication  refills as evidenced by patient report and review of pharmacy fill history as appropriate -Patient/Caregiver will call provider office for new concerns or questions as evidenced by review of documented incoming telephone call notes and patient report -Patient/Caregiver verbalizes understanding of plan -Patient/Caregiver will focus on medication adherence by taking medications as prescribed -Continue use of heat and rubs for temporary relief     Lazaro Arms RN, BSN, [place] Management Coordinator [facility]  Phone: [phone]

## 2021-07-24 DIAGNOSIS — G8929 Other chronic pain: Secondary | ICD-10-CM | POA: Diagnosis not present

## 2021-07-24 DIAGNOSIS — I69354 Hemiplegia and hemiparesis following cerebral infarction affecting left non-dominant side: Secondary | ICD-10-CM | POA: Diagnosis not present

## 2021-07-25 DIAGNOSIS — K648 Other hemorrhoids: Secondary | ICD-10-CM | POA: Diagnosis not present

## 2021-07-25 DIAGNOSIS — Z1211 Encounter for screening for malignant neoplasm of colon: Secondary | ICD-10-CM | POA: Diagnosis not present

## 2021-07-25 DIAGNOSIS — K635 Polyp of colon: Secondary | ICD-10-CM | POA: Diagnosis not present

## 2021-07-25 DIAGNOSIS — Z8371 Family history of colonic polyps: Secondary | ICD-10-CM | POA: Diagnosis not present

## 2021-07-27 DIAGNOSIS — K635 Polyp of colon: Secondary | ICD-10-CM | POA: Diagnosis not present

## 2021-08-02 ENCOUNTER — Other Ambulatory Visit: Payer: Self-pay | Admitting: Student in an Organized Health Care Education/Training Program

## 2021-08-02 NOTE — Telephone Encounter (Signed)
Face to face visit needed for wheelchair.  Patient has already been scheduled for 7/18 with PCP.

## 2021-08-09 NOTE — Progress Notes (Signed)
    SUBJECTIVE:   CHIEF COMPLAINT / HPI:   Wheelchair Vincent Byrd is a 61 y.o. male with PMHx significant for CVA that has left him with left-sided hemiparesis, who presents to the clinic today to discuss new wheelchair. He states that his current wheelchair is not quite 62 years old but is causing problems. The joystick does not work as well, moves very slow. He sometimes has to transfer into his "old chair" at home when this wheelchair gives out.  Tobacco Abuse  Smoking 1 pack per day.  Felt like his smoking picked back up due to stress from his wheelchair. He is interested in trying patches again. He has been on Wellbutrin in the past but did not like it- does not want to go back on it.    PERTINENT  PMH / PSH:  Past Medical History:  Diagnosis Date   Hemiparesis (HCC)    affecting left side, secondary to stroke in 1997   Stroke (HCC)    OBJECTIVE:   BP (!) 156/79   Pulse 67   SpO2 97%   Vitals:   08/14/21 0951 08/14/21 1009  BP: (!) 155/77 (!) 156/79  Pulse: 67   SpO2: 97%    General: NAD, pleasant, in motorized wheelchair, left-sided hemiparesis  Cardiac: RRR Respiratory: Rhonchi in b/l lower bases Psych: Normal affect and mood  ASSESSMENT/PLAN:   Hemiparesis affecting left side as late effect of stroke (HCC) He is dependent on motorized wheelchair to get around.  I have received forms for new wheelchair that I will fax over. I believe it is appropriate for him to obtain a new wheelchair as his current wheelchair is having technical difficulties and looks very worn out.   Tobacco abuse Has not tolerated Wellbutrin in the past.  We discussed other methods such as Chantix, nicotine patches, Nicorette gum.  Patient would like to try the patches, would like to get these over-the-counter. Encouraged him to reach out to our clinic and/or pharmacy team for assistance with tobacco cessation if he would like.  Hypertension Has not been on any antihypertensive medications.   Upon review of his previous vitals he has been intermittently hypertensive to the 150s systolic. Given his previous hx of CVA, it is important that we obtain better BP control. -Start Amlodipine 5 mg daily -Patient to return once he obtains a new wheelchair for f/u for BP recheck    Sabino Dick, DO [facility]

## 2021-08-14 ENCOUNTER — Ambulatory Visit (INDEPENDENT_AMBULATORY_CARE_PROVIDER_SITE_OTHER): Payer: Medicare HMO | Admitting: Family Medicine

## 2021-08-14 DIAGNOSIS — I10 Essential (primary) hypertension: Secondary | ICD-10-CM | POA: Diagnosis not present

## 2021-08-14 DIAGNOSIS — I69354 Hemiplegia and hemiparesis following cerebral infarction affecting left non-dominant side: Secondary | ICD-10-CM | POA: Diagnosis not present

## 2021-08-14 DIAGNOSIS — Z720 Tobacco use: Secondary | ICD-10-CM

## 2021-08-14 MED ORDER — AMLODIPINE BESYLATE 5 MG PO TABS: 5.0000 mg | ORAL_TABLET | Freq: Every day | ORAL | 3 refills | Status: DC

## 2021-08-14 NOTE — Assessment & Plan Note (Signed)
Has not been on any antihypertensive medications.  Upon review of his previous vitals he has been intermittently hypertensive to the 150s systolic. Given his previous hx of CVA, it is important that we obtain better BP control. -Start Amlodipine 5 mg daily -Patient to return once he obtains a new wheelchair for f/u for BP recheck

## 2021-08-14 NOTE — Assessment & Plan Note (Signed)
Has not tolerated Wellbutrin in the past.  We discussed other methods such as Chantix, nicotine patches, Nicorette gum.  Patient would like to try the patches, would like to get these over-the-counter. Encouraged him to reach out to our clinic and/or pharmacy team for assistance with tobacco cessation if he would like.

## 2021-08-14 NOTE — Patient Instructions (Addendum)
It was wonderful to see you today.  Please bring ALL of your medications with you to every visit.   Today we talked about:  -We will get your wheelchair form faxed.  -Please work on cutting back on smoking!! I know that you can do it! Please contact the free hotline below. Our pharmacy team is also happy to assist you with this. -Your blood pressure was elevated today. I am starting you on a medication called Amlodipine. Take it once a day. Come back and see me for a blood pressure follow up when you get your new chair.   As you know, tobacco use is damaging to your body. It increases your risk of stroke, heart attack, lung cancer, and serious lung disease in the future.   Quitting tobacco is the best thing for your health but is a challenge---nicotine, a chemical in cigarettes, is highly addictive.   You can call 1 800 QUIT NOW (225-550-2423)---you will be connected with a Careers information officer. They can also mail you nicotine gums, lozenges, and patches to quit.    Thank you for choosing [facility].   Please call [phone] with any questions about today's appointment.  Please be sure to schedule follow up at the front  desk before you leave today.   Sabino Dick, DO PGY-3 Family Medicine

## 2021-08-14 NOTE — Assessment & Plan Note (Signed)
He is dependent on motorized wheelchair to get around.  I have received forms for new wheelchair that I will fax over. I believe it is appropriate for him to obtain a new wheelchair as his current wheelchair is having technical difficulties and looks very worn out.

## 2021-09-18 DIAGNOSIS — I6789 Other cerebrovascular disease: Secondary | ICD-10-CM | POA: Diagnosis not present

## 2021-09-18 DIAGNOSIS — G8194 Hemiplegia, unspecified affecting left nondominant side: Secondary | ICD-10-CM | POA: Diagnosis not present

## 2021-09-24 DIAGNOSIS — G8929 Other chronic pain: Secondary | ICD-10-CM | POA: Diagnosis not present

## 2021-09-24 DIAGNOSIS — I69354 Hemiplegia and hemiparesis following cerebral infarction affecting left non-dominant side: Secondary | ICD-10-CM | POA: Diagnosis not present

## 2021-09-24 DIAGNOSIS — Z9181 History of falling: Secondary | ICD-10-CM | POA: Diagnosis not present

## 2021-09-24 DIAGNOSIS — Z993 Dependence on wheelchair: Secondary | ICD-10-CM | POA: Diagnosis not present

## 2021-09-28 ENCOUNTER — Telehealth: Payer: Self-pay

## 2021-09-28 NOTE — Telephone Encounter (Signed)
Patient calls nurse line in regards to blood pressure.   Patient reports he has been taking Amlodipine as prescribed. Patient reports a nurse visits his home every 3 months and recently did a visit this week. Patient reports he doesn't remember the "bottom" number, however reports the "top" number was 129.  Patient has concerns this may be too low. Advised patient without the "bottom" number I can not confirm or deny his pressure being "normal." However, reassured him his "top" number was within normal limits.   Patient advised to FU with PCP. However, patient declined stating he has been in and out of doctors and doesn't want to come.   Patient denies dizziness, headaches, vision changes, SOB or chest pains.   Patients to have his BP checked somehow and to call with numbers. Encouraged PCP visit.   Patient agreed with plan.   ED precautions given.

## 2021-10-23 NOTE — Progress Notes (Deleted)
Patient not seen- Erroneous encounter.  

## 2021-12-25 ENCOUNTER — Telehealth: Payer: Self-pay

## 2021-12-25 NOTE — Telephone Encounter (Signed)
Patient calls nurse line to discuss paperwork that he is needing completed for the [place]. 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   Patient is mailing forms to our office due to limited transportation.   Advised patient that appointment may be needed for provider to complete paperwork. Patient has upcoming appointment on 01/10/22.   FYI to PCP.   Veronda Prude, RN

## 2021-12-27 ENCOUNTER — Ambulatory Visit: Payer: Self-pay | Admitting: Licensed Clinical Social Worker

## 2021-12-27 NOTE — Patient Outreach (Signed)
  Care Coordination   Follow Up Visit Note   12/27/2021 Name: Vincent Byrd MRN: [id number] DOB: [birth year]  Vincent Byrd is a 61 y.o. year old male who sees Sabino Dick, DO for primary care.   SW removed from care team on today. Patient aware to contact PCP if assistance is needed in future.    SDOH assessments and interventions completed:  Yes     Care Coordination Interventions:  Yes, provided   Follow up plan: No further intervention required.   Encounter Outcome:  Pt. Visit Completed

## 2022-01-10 ENCOUNTER — Other Ambulatory Visit: Payer: Self-pay

## 2022-01-10 ENCOUNTER — Encounter: Payer: Self-pay | Admitting: Family Medicine

## 2022-01-10 ENCOUNTER — Ambulatory Visit (INDEPENDENT_AMBULATORY_CARE_PROVIDER_SITE_OTHER): Payer: Medicare HMO | Admitting: Family Medicine

## 2022-01-10 VITALS — BP 120/69 | HR 74

## 2022-01-10 DIAGNOSIS — Z720 Tobacco use: Secondary | ICD-10-CM

## 2022-01-10 DIAGNOSIS — Z23 Encounter for immunization: Secondary | ICD-10-CM | POA: Diagnosis not present

## 2022-01-10 DIAGNOSIS — I10 Essential (primary) hypertension: Secondary | ICD-10-CM

## 2022-01-10 DIAGNOSIS — B351 Tinea unguium: Secondary | ICD-10-CM | POA: Diagnosis not present

## 2022-01-10 MED ORDER — AMLODIPINE BESYLATE 5 MG PO TABS: 5.0000 mg | ORAL_TABLET | Freq: Every day | ORAL | 3 refills | Status: DC

## 2022-01-10 MED ORDER — NICOTINE 21 MG/24HR TD PT24: 21.0000 mg | MEDICATED_PATCH | Freq: Every day | TRANSDERMAL | 0 refills | Status: DC

## 2022-01-10 NOTE — Patient Instructions (Addendum)
It was wonderful to see you today.  Please bring ALL of your medications with you to every visit.   Today we talked about:  We are doing lab work today to check your liver function. If normal I will send over the medication for your toenails.   I have refilled your blood pressure medication. It looks much improved today!  Thank you for coming to your visit as scheduled. We have had a large "no-show" problem lately, and this significantly limits our ability to see and care for patients. As a friendly reminder- if you cannot make your appointment please call to cancel. We do have a no show policy for those who do not cancel within 24 hours. Our policy is that if you miss or fail to cancel an appointment within 24 hours, 3 times in a 42-month period, you may be dismissed from our clinic.   Thank you for choosing [facility].   Please call [phone] with any questions about today's appointment.  Please be sure to schedule follow up at the front  desk before you leave today.   Sabino Dick, DO PGY-3 Family Medicine

## 2022-01-10 NOTE — Assessment & Plan Note (Signed)
Normotensive and at goal today.  -Continue Amlodipine

## 2022-01-10 NOTE — Progress Notes (Signed)
    SUBJECTIVE:   CHIEF COMPLAINT / HPI:   Blood Pressure F/U Vincent Byrd is a 61 y.o. male who presents to the clinic today for follow up on blood pressure. He was last seen in July and started on Amlodipine 5 mg at that time. Reports good compliance without side effects. Does not have a home BP cuff to measure. Denies chest pain, shortness of breath, lower extremity edema, headaches, and vision changes.  Onychomycosis  Patient would also like to check up on his paperwork that he dropped off in November. This is for medical compensation from the [place]. He reports that he has had fungus on his feet since his time in the [employer]. 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 He has tried several medications for this. He feels like the ketoconazole helps best. He continues to use this daily.     Tobacco Use Disorder Still smoking 1 pack per day. He is not up to date on lung cancer screening. He would prefer to wait until the spring. He does not want to come out into the cold right now. He understands the risks of delayed screening.   PERTINENT  PMH / PSH:  Past Medical History:  Diagnosis Date   Hemiparesis (HCC)    affecting left side, secondary to stroke in 1997   Stroke (HCC)     OBJECTIVE:   BP 120/69   Pulse 74   SpO2 100%    General: NAD, pleasant, wheelchair bound, left sided hemiparesis, smells of tobacco  Cardiac: RRR, no murmurs. Respiratory: normal effort, rhonchi at right lower base, slight inspiratory and expiratory wheeze right upper lobe Extremities: no edema. Onychomycosis of bilateral toes  Psych: Normal affect and mood    ASSESSMENT/PLAN:   Hypertension Normotensive and at goal today.  -Continue Amlodipine   Tobacco abuse Still smoking 1 ppd. He is interested in nicotine patches and has also been trying to wean by placing a straw or toothpick in his mouth instead. He is overdue for lung cancer screening but would like to post-pone until  the spring time when the weather is warmer. -Encouraged cessation  -21 mg nicotine patches sent -Low-dose CT for lung cancer screening Spring 2024  Onychomycosis Using topical ketoconazole. He reports he has not tried oral anti-fungal for this in the past. This has been present for several years. Discussed that the treatment would be over the course of 3 months and may or may not improve appearance of toenails since this has been very long-standing. -CMP to evaluate liver function -If liver enzymes appropriate, plan to start 12 weeks of terbinafine 250 mg daily x12 weeks -Will check on paperwork as I do not recall receiving this. Patient is aware.  Influenza vaccination given Provided today without complication.   Sabino Dick, DO [place]

## 2022-01-10 NOTE — Assessment & Plan Note (Signed)
Still smoking 1 ppd. He is interested in nicotine patches and has also been trying to wean by placing a straw or toothpick in his mouth instead. He is overdue for lung cancer screening but would like to post-pone until the spring time when the weather is warmer. -Encouraged cessation  -21 mg nicotine patches sent -Low-dose CT for lung cancer screening Spring 2024

## 2022-01-10 NOTE — Assessment & Plan Note (Signed)
Provided today without complication. 

## 2022-01-10 NOTE — Assessment & Plan Note (Signed)
Using topical ketoconazole. He reports he has not tried oral anti-fungal for this in the past. This has been present for several years. Discussed that the treatment would be over the course of 3 months and may or may not improve appearance of toenails since this has been very long-standing. -CMP to evaluate liver function -If liver enzymes appropriate, plan to start 12 weeks of terbinafine 250 mg daily x12 weeks -Will check on paperwork as I do not recall receiving this. Patient is aware.

## 2022-01-11 ENCOUNTER — Other Ambulatory Visit: Payer: Self-pay | Admitting: Family Medicine

## 2022-01-11 LAB — COMPREHENSIVE METABOLIC PANEL
ALT: 7 IU/L (ref 0–44)
AST: 17 IU/L (ref 0–40)
Albumin/Globulin Ratio: 1.9 (ref 1.2–2.2)
Albumin: 4.7 g/dL (ref 3.9–4.9)
Alkaline Phosphatase: 53 IU/L (ref 44–121)
BUN/Creatinine Ratio: 10 (ref 10–24)
BUN: 8 mg/dL (ref 8–27)
Bilirubin Total: 0.5 mg/dL (ref 0.0–1.2)
CO2: 24 mmol/L (ref 20–29)
Calcium: 9.8 mg/dL (ref 8.6–10.2)
Chloride: 101 mmol/L (ref 96–106)
Creatinine, Ser: 0.83 mg/dL (ref 0.76–1.27)
Globulin, Total: 2.5 g/dL (ref 1.5–4.5)
Glucose: 92 mg/dL (ref 70–99)
Potassium: 4.5 mmol/L (ref 3.5–5.2)
Sodium: 141 mmol/L (ref 134–144)
Total Protein: 7.2 g/dL (ref 6.0–8.5)
eGFR: 99 mL/min/{1.73_m2} (ref >59)

## 2022-01-11 MED ORDER — TERBINAFINE HCL 250 MG PO TABS: 250.0000 mg | ORAL_TABLET | Freq: Every day | ORAL | 0 refills | Status: AC

## 2022-01-11 NOTE — Progress Notes (Signed)
Stable liver function. Discussed results of CMP with patient. Rx Terbinafine 250 mg x12 weeks for onychomycosis.

## 2022-01-30 ENCOUNTER — Encounter: Payer: Self-pay | Admitting: Family Medicine

## 2022-01-30 NOTE — Progress Notes (Unsigned)
Received letter from the [place] with patients signed consent to have medical records released to the [place] office. Requesting all records. Will have admin team send all office notes from date that patient established 06/2014.

## 2022-02-20 ENCOUNTER — Telehealth: Payer: Self-pay | Admitting: Family Medicine

## 2022-02-20 NOTE — Telephone Encounter (Signed)
Received disability parking placard for patient. Form completed. Called patient to see how he would like to receive this back, he would prefer that we mail it back to him. Discussed this could take several weeks. He understood and wanted to proceed with mailing.   We will mail form back to patients home.

## 2022-03-14 ENCOUNTER — Telehealth: Payer: Self-pay

## 2022-03-14 ENCOUNTER — Encounter: Payer: Self-pay | Admitting: Family Medicine

## 2022-03-14 NOTE — Telephone Encounter (Signed)
Letter routed to front admin to be mailed. Discussed with patient.

## 2022-03-14 NOTE — Telephone Encounter (Signed)
Patient LVM on nurse line requesting a letter excusing him from jury duty.   I attempted to call patient back to get service date. However, no answer.  Will forward to PCP.

## 2022-03-14 NOTE — Progress Notes (Signed)
Letter to excuse from jury duty sent due to his limited mobility and chronic medical conditions. Letter will be mailed. Patient aware that this could take several weeks and was okay with this. He could not find the exact date of his jury duty for me to include in the letter.

## 2022-03-18 NOTE — Progress Notes (Unsigned)
I connected with  [name] on 03/19/2022 by a audio enabled telemedicine application and verified that I am speaking with the correct person using two identifiers.  Patient Location: Home  Provider Location: Home Office  I discussed the limitations of evaluation and management by telemedicine. The patient expressed understanding and agreed to proceed.  Subjective:   YONEL MALDEN is a 62 y.o. male who presents for Medicare Annual/Subsequent preventive examination.  Review of Systems    Per HPI unless specifically indicated below.  Cardiac Risk Factors include: advanced age (>49mn, >>57women);male gender, Hypertension.       Objective:    .    01/10/2022    9:45 AM 08/14/2021   10:09 AM 08/14/2021    9:51 AM  Vitals with BMI  Height   5' [id number]  Systolic [id number] Diastolic 69 79 77  Pulse 74  67    There were no vitals filed for this visit. There is no height or weight on file to calculate BMI.     01/10/2022   10:04 AM 08/14/2021   12:06 PM 06/06/2021   10:58 AM 03/09/2021    9:04 AM 02/07/2021    9:22 AM 11/28/2020    1:34 PM 10/14/2020   10:25 AM  Advanced Directives  Does Patient Have a Medical Advance Directive? No No No No No No No  Would patient like information on creating a medical advance directive? No - Patient declined No - Patient declined No - Patient declined No - Patient declined No - Patient declined No - Patient declined Yes (MAU/Ambulatory/Procedural Areas - Information given)    Current Medications (verified) Outpatient Encounter Medications as of 03/19/2022  Medication Sig   amLODipine (NORVASC) 5 MG tablet Take 1 tablet (5 mg total) by mouth at bedtime.   aspirin EC 81 MG tablet Take 81 mg by mouth daily. Swallow whole.   atorvastatin (LIPITOR) 40 MG tablet Take 1 tablet (40 mg total) by mouth daily. (Patient not taking: Reported on 01/10/2022)   Baclofen 5 MG TABS Take 1 tablet by mouth 3 (three) times daily as needed.    Capsaicin 0.05 % CREA Apply 1 application topically every 4 (four) hours as needed (Hand pain).   fluticasone (FLONASE) 50 MCG/ACT nasal spray PLACE 2 SPRAYS INTO BOTH NOSTRILS DAILY.   gabapentin (NEURONTIN) 100 MG capsule Take 1 capsule (100 mg total) by mouth 3 (three) times daily.   guaiFENesin (MUCINEX) 600 MG 12 hr tablet Take 1 tablet (600 mg total) by mouth 2 (two) times daily. (Patient not taking: Reported on 08/14/2021)   ibuprofen (ADVIL) 600 MG tablet Take 1 tablet (600 mg total) by mouth every 6 (six) hours as needed. (Patient not taking: Reported on 06/06/2021)   ketoconazole (NIZORAL) 2 % cream APPLY TOPICALLY EVERY DAY   nicotine (NICODERM CQ - DOSED IN MG/24 HOURS) 21 mg/24hr patch Place 1 patch (21 mg total) onto the skin daily.   Nutritional Supplements (ENSURE ACTIVE HIGH PROTEIN) LIQD Take 16 oz by mouth 2 (two) times daily.   polyethylene glycol powder (GLYCOLAX/MIRALAX) 17 GM/SCOOP powder Take 17 g by mouth daily. (Patient not taking: Reported on 03/09/2021)   terbinafine (LAMISIL) 250 MG tablet Take 1 tablet (250 mg total) by mouth daily.   Vitamin D, Ergocalciferol, (DRISDOL) 1.25 MG (50000 UT) CAPS capsule TAKE 1 CAPSULE EVERY 7 DAYS (Patient not taking: Reported on 01/10/2022)   zinc oxide (MEIJER ZINC OXIDE) 20 % ointment Apply 1 application  topically as needed for irritation. (Patient not taking: Reported on 12/17/2019)   No facility-administered encounter medications on file as of 03/19/2022.    Allergies (verified) Amoxicillin and Ampicillin   History: Past Medical History:  Diagnosis Date   Hemiparesis ([place])    affecting left side, secondary to stroke in 1997   Stroke [facility])    Past Surgical History:  Procedure Laterality Date   APPENDECTOMY     age 61   TONSILLECTOMY     Family History  Problem Relation Age of Onset   Diabetes Mother    Breast cancer Mother    Colon cancer Brother 31   Social History   Socioeconomic History   Marital status:  Divorced    Spouse name: Not on file   Number of children: 2   Years of education: some college   Highest education level: Some college, no degree  Occupational History   Occupation: disabled    Comment: tobacco Psychologist, educational  Tobacco Use   Smoking status: Every Day    Packs/day: 1.00    Years: 35.00    Total pack years: 35.00    Types: Cigarettes   Smokeless tobacco: Never   Tobacco comments:    wants to discuss further with PCP  Vaping Use   Vaping Use: Never used  Substance and Sexual Activity   Alcohol use: Yes    Alcohol/week: 6.0 standard drinks of alcohol    Types: 6 Cans of beer per week    Comment: 6 on weekend   Drug use: No   Sexual activity: Not Currently  Other Topics Concern   Not on file  Social History Narrative   Lives with son. Lives in house, one level, has step in shower, has a ramp for wheelchair. Has grab bars in restroom. Smoke alarms.      Has medical alert button and a home health aide 4 days a week for 2 hours. Would like to have [facility] aide 7 days a week.      Has a modified Lucianne Lei he drives and uses [employer].      Has a chihuahua named JoJo.       Likes to sit on porch, likes to listen to radio.      Likes to eat mostly meats, wants to add in vegetables, eats some fruits, has a sweet tooth. Likes ginger ale and beer.      Wears seat belt in vehicle.    Social Determinants of Health   Financial Resource Strain: Low Risk  (02/07/2021)   Overall Financial Resource Strain (CARDIA)    Difficulty of Paying Living Expenses: Not hard at all  Food Insecurity: No Food Insecurity (02/07/2021)   Hunger Vital Sign    Worried About Running Out of Food in the Last Year: Never true    Ran Out of Food in the Last Year: Never true  Transportation Needs: No Transportation Needs (02/07/2021)   PRAPARE - Hydrologist (Medical): No    Lack of Transportation (Non-Medical): No  Physical Activity: Inactive (10/14/2020)   Exercise Vital Sign     Days of Exercise per Week: 0 days    Minutes of Exercise per Session: 0 min  Stress: No Stress Concern Present (10/14/2020)   [place]    Feeling of Stress : Not at all  Social Connections: Unknown (02/07/2021)   Social Connection and Isolation Panel [NHANES]    Frequency of Communication with  Friends and Family: More than three times a week    Frequency of Social Gatherings with Friends and Family: More than three times a week    Attends Religious Services: Not on Advertising copywriter or Organizations: Not on file    Attends Archivist Meetings: Not on file    Marital Status: Not on file  Recent Concern: Social Connections - Socially Isolated (02/07/2021)   Social Connection and Isolation Panel [NHANES]    Frequency of Communication with Friends and Family: More than three times a week    Frequency of Social Gatherings with Friends and Family: More than three times a week    Attends Religious Services: Never    Marine scientist or Organizations: No    Attends Music therapist: Never    Marital Status: Divorced    Tobacco Counseling Ready to quit: Not Answered Counseling given: Not Answered Tobacco comments: wants to discuss further with PCP   Clinical Intake:                 Diabetic?No          Activities of Daily Living     No data to display          Patient Care Team: Sharion Settler, DO as PCP - General (Family Medicine) Gastroenterology, [place]ana Badger any recent Medical Services you may have received from other than [facility] providers in the past year (date may be approximate).     Assessment:   This is a routine wellness examination for Jessica.  Hearing/Vision screen Denies any hearing issues. Denies any vision changes. Annual Eye Exam  Dietary issues and exercise activities discussed:     Goals Addressed   None    Depression  Screen    08/14/2021   12:07 PM 06/06/2021   10:58 AM 03/09/2021    9:01 AM 12/15/2020    8:52 AM 11/28/2020    1:34 PM 10/14/2020   10:18 AM 07/19/2020   11:23 AM  PHQ 2/9 Scores  PHQ - 2 Score 0 0 3 3 0 0 2  PHQ- 9 Score 0 0 5 3 3  2    $ Fall Risk    01/10/2022    9:59 AM 03/09/2021    9:04 AM 02/12/2021    9:25 AM 11/28/2020    1:34 PM 10/14/2020   10:27 AM  Fall Risk   Falls in the past year? 0 0 0 0 0  Number falls in past yr: 0 0  0 0  Injury with Fall? 0 0  0 0  Risk for fall due to :     Impaired balance/gait;Impaired mobility  Follow up     Falls evaluation completed    FALL RISK PREVENTION PERTAINING TO THE HOME:  Any stairs in or around the home? {YES/NO:21197} If so, are there any without handrails? {YES/NO:21197} Home free of loose throw rugs in walkways, pet beds, electrical cords, etc? {YES/NO:21197} Adequate lighting in your home to reduce risk of falls? {YES/NO:21197}  ASSISTIVE DEVICES UTILIZED TO PREVENT FALLS:  Life alert? {YES/NO:21197} Use of a cane, walker or w/c? {YES/NO:21197} Grab bars in the bathroom? {YES/NO:21197} Shower chair or bench in shower? {YES/NO:21197} Elevated toilet seat or a handicapped toilet? {YES/NO:21197}  TIMED UP AND GO:  Was the test performed? Unable to perform, virtual appointment   Cognitive Function:    11/21/2017   10:31 AM  MMSE - Mini Mental State Exam  Orientation to time 5  Orientation to Place 5  Registration 3  Attention/ Calculation 5  Recall 3  Language- name 2 objects 2  Language- repeat 1  Language- follow 3 step command 3  Language- read & follow direction 1  Write a sentence 1  Copy design 1  Total score 30        10/14/2020   10:28 AM 11/21/2017   10:32 AM  6CIT Screen  What Year? 0 points 0 points  What month? 0 points 0 points  What time? 0 points 0 points  Count back from 20 0 points 0 points  Months in reverse 0 points 0 points  Repeat phrase 0 points 0 points  Total Score 0 points  0 points    Immunizations Immunization History  Administered Date(s) Administered   Influenza,inj,Quad PF,6+ Mos 11/28/2020, 01/10/2022   Moderna Sars-Covid-2 Vaccination 05/25/2019, 09/27/2019   Tdap 11/04/2014    TDAP status: Up to date  Flu Vaccine status: Up to date  Pneumococcal vaccine status: Up to date  Covid-19 vaccine status: Information provided on how to obtain vaccines.   Qualifies for Shingles Vaccine? Yes   Zostavax completed No   Shingrix Completed?: No.    Education has been provided regarding the importance of this vaccine. Patient has been advised to call insurance company to determine out of pocket expense if they have not yet received this vaccine. Advised may also receive vaccine at local pharmacy or Health Dept. Verbalized acceptance and understanding.  Screening Tests Health Maintenance  Topic Date Due   Zoster Vaccines- Shingrix (1 of 2) Never done   Lung Cancer Screening  01/11/2021   COVID-19 Vaccine (3 - 2023-24 season) 09/28/2021   Medicare Annual Wellness (AWV)  10/14/2021   DTaP/Tdap/Td (2 - Td or Tdap) 11/03/2024   COLONOSCOPY (Pts 45-58yr Insurance coverage will need to be confirmed)  09/18/2031   INFLUENZA VACCINE  Completed   Hepatitis C Screening  Completed   HIV Screening  Completed   HPV VACCINES  Aged Out    Health Maintenance  Health Maintenance Due  Topic Date Due   Zoster Vaccines- Shingrix (1 of 2) Never done   Lung Cancer Screening  01/11/2021   COVID-19 Vaccine (3 - 2023-24 season) 09/28/2021   Medicare Annual Wellness (AWV)  10/14/2021    Colorectal cancer screening: Type of screening: Colonoscopy. Completed 09/17/2021. Repeat every 10 years  Lung Cancer Screening: (Low Dose CT Chest recommended if Age 63-80years, 30 pack-year currently smoking OR have quit w/in 15years.) does not qualify.   Lung Cancer Screening Referral: not applicable  Additional Screening:  Hepatitis C Screening: does qualify; Completed  06/06/2021  Vision Screening: Recommended annual ophthalmology exams for early detection of glaucoma and other disorders of the eye. Is the patient up to date with their annual eye exam?  Yes  Who is the provider or what is the name of the office in which the patient attends annual eye exams? *** If pt is not established with a provider, would they like to be referred to a provider to establish care? No .   Dental Screening: Recommended annual dental exams for proper oral hygiene  Community Resource Referral / Chronic Care Management: CRR required this visit?  No   CCM required this visit?  No      Plan:     I have personally reviewed and noted the following in the patient's chart:   Medical and social history Use of alcohol, tobacco or illicit  drugs  Current medications and supplements including opioid prescriptions. Patient is not currently taking opioid prescriptions. Functional ability and status Nutritional status Physical activity Advanced directives List of other physicians Hospitalizations, surgeries, and ER visits in previous 12 months Vitals Screenings to include cognitive, depression, and falls Referrals and appointments  In addition, I have reviewed and discussed with patient certain preventive protocols, quality metrics, and best practice recommendations. A written personalized care plan for preventive services as well as general preventive health recommendations were provided to patient.    Mr. Terhune , Thank you for taking time to come for your Medicare Wellness Visit. I appreciate your ongoing commitment to your health goals. Please review the following plan we discussed and let me know if I can assist you in the future.   These are the goals we discussed:  Goals      DIET - EAT MORE FRUITS AND VEGETABLES     Quit Smoking        This is a list of the screening recommended for you and due dates:  Health Maintenance  Topic Date Due   Zoster (Shingles)  Vaccine (1 of 2) Never done   Screening for Lung Cancer  01/11/2021   COVID-19 Vaccine (3 - 2023-24 season) 09/28/2021   Medicare Annual Wellness Visit  03/20/2023   DTaP/Tdap/Td vaccine (2 - Td or Tdap) 11/03/2024   Colon Cancer Screening  09/18/2031   Flu Shot  Completed   Hepatitis C Screening: USPSTF Recommendation to screen - Ages 18-79 yo.  Completed   HIV Screening  Completed   HPV Vaccine  Aged [address] [place]   03/19/2022  Nurse Notes: Approximately 30 minute Non-Face -To-Face Medicare Wellness Visit

## 2022-03-18 NOTE — Patient Instructions (Signed)
Health Maintenance, Male Adopting a healthy lifestyle and getting preventive care are important in promoting health and wellness. Ask your health care provider about: The right schedule for you to have regular tests and exams. Things you can do on your own to prevent diseases and keep yourself healthy. What should I know about diet, weight, and exercise? Eat a healthy diet  Eat a diet that includes plenty of vegetables, fruits, low-fat dairy products, and lean protein. Do not eat a lot of foods that are high in solid fats, added sugars, or sodium. Maintain a healthy weight Body mass index (BMI) is a measurement that can be used to identify possible weight problems. It estimates body fat based on height and weight. Your health care provider can help determine your BMI and help you achieve or maintain a healthy weight. Get regular exercise Get regular exercise. This is one of the most important things you can do for your health. Most adults should: Exercise for at least 150 minutes each week. The exercise should increase your heart rate and make you sweat (moderate-intensity exercise). Do strengthening exercises at least twice a week. This is in addition to the moderate-intensity exercise. Spend less time sitting. Even light physical activity can be beneficial. Watch cholesterol and blood lipids Have your blood tested for lipids and cholesterol at 63 years of age, then have this test every 5 years. You may need to have your cholesterol levels checked more often if: Your lipid or cholesterol levels are high. You are older than 63 years of age. You are at high risk for heart disease. What should I know about cancer screening? Many types of cancers can be detected early and may often be prevented. Depending on your health history and family history, you may need to have cancer screening at various ages. This may include screening for: Colorectal cancer. Prostate cancer. Skin cancer. Lung  cancer. What should I know about heart disease, diabetes, and high blood pressure? Blood pressure and heart disease High blood pressure causes heart disease and increases the risk of stroke. This is more likely to develop in people who have high blood pressure readings or are overweight. Talk with your health care provider about your target blood pressure readings. Have your blood pressure checked: Every 3-5 years if you are 18-39 years of age. Every year if you are 40 years old or older. If you are between the ages of 65 and 75 and are a current or former smoker, ask your health care provider if you should have a one-time screening for abdominal aortic aneurysm (AAA). Diabetes Have regular diabetes screenings. This checks your fasting blood sugar level. Have the screening done: Once every three years after age 45 if you are at a normal weight and have a low risk for diabetes. More often and at a younger age if you are overweight or have a high risk for diabetes. What should I know about preventing infection? Hepatitis B If you have a higher risk for hepatitis B, you should be screened for this virus. Talk with your health care provider to find out if you are at risk for hepatitis B infection. Hepatitis C Blood testing is recommended for: Everyone born from 1945 through 1965. Anyone with known risk factors for hepatitis C. Sexually transmitted infections (STIs) You should be screened each year for STIs, including gonorrhea and chlamydia, if: You are sexually active and are younger than 63 years of age. You are older than 63 years of age and your   health care provider tells you that you are at risk for this type of infection. Your sexual activity has changed since you were last screened, and you are at increased risk for chlamydia or gonorrhea. Ask your health care provider if you are at risk. Ask your health care provider about whether you are at high risk for HIV. Your health care provider  may recommend a prescription medicine to help prevent HIV infection. If you choose to take medicine to prevent HIV, you should first get tested for HIV. You should then be tested every 3 months for as long as you are taking the medicine. Follow these instructions at home: Alcohol use Do not drink alcohol if your health care provider tells you not to drink. If you drink alcohol: Limit how much you have to 0-2 drinks a day. Know how much alcohol is in your drink. In the [place] one drink equals one 12 oz bottle of beer (355 mL), one 5 oz glass of wine (148 mL), or one 1 oz glass of hard liquor (44 mL). Lifestyle Do not use any products that contain nicotine or tobacco. These products include cigarettes, chewing tobacco, and vaping devices, such as e-cigarettes. If you need help quitting, ask your health care provider. Do not use street drugs. Do not share needles. Ask your health care provider for help if you need support or information about quitting drugs. General instructions Schedule regular health, dental, and eye exams. Stay current with your vaccines. Tell your health care provider if: You often feel depressed. You have ever been abused or do not feel safe at home. Summary Adopting a healthy lifestyle and getting preventive care are important in promoting health and wellness. Follow your health care provider's instructions about healthy diet, exercising, and getting tested or screened for diseases. Follow your health care provider's instructions on monitoring your cholesterol and blood pressure. This information is not intended to replace advice given to you by your health care provider. Make sure you discuss any questions you have with your health care provider. Document Revised: 06/05/2020 Document Reviewed: 06/05/2020 Elsevier Patient Education  2023 Elsevier Inc.  

## 2022-03-19 ENCOUNTER — Ambulatory Visit (INDEPENDENT_AMBULATORY_CARE_PROVIDER_SITE_OTHER): Payer: Medicare HMO

## 2022-03-19 DIAGNOSIS — Z0000 Encounter for general adult medical examination without abnormal findings: Secondary | ICD-10-CM | POA: Diagnosis not present

## 2022-03-19 DIAGNOSIS — Z5941 Food insecurity: Secondary | ICD-10-CM

## 2022-03-20 ENCOUNTER — Telehealth: Payer: Self-pay | Admitting: *Deleted

## 2022-03-20 NOTE — Progress Notes (Signed)
  Care Coordination   Note   03/20/2022 Name: ASKARI ANSARI MRN: [id number] DOB: 12-Feb-1960  [name] is a 62 y.o. year old male who sees Sharion Settler, DO for primary care. I reached out to [name] by phone today to offer care coordination services.  Mr. Flaig was given information about Care Coordination services today including:   The Care Coordination services include support from the care team which includes your Nurse Coordinator, Clinical Social Worker, or Pharmacist.  The Care Coordination team is here to help remove barriers to the health concerns and goals most important to you. Care Coordination services are voluntary, and the patient may decline or stop services at any time by request to their care team member.   Care Coordination Consent Status: Patient agreed to services and verbal consent obtained.   Follow up plan:  Telephone appointment with care coordination team member scheduled for:  03/22/22  Encounter Outcome:  Pt. Scheduled  [place]  Direct Dial: [phone]

## 2022-03-22 ENCOUNTER — Ambulatory Visit: Payer: Self-pay

## 2022-03-22 NOTE — Patient Instructions (Signed)
Visit Information  Thank you for taking time to visit with me today. Please don't hesitate to contact me if I can be of assistance to you.   Following are the goals we discussed today:   Goals Addressed             This Visit's Progress    Care Coordination Activities       Care Coordination Interventions: Referral received indicating patient is experiencing food insecurity Determined the patient has FNS benefits of $23 per month. 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 Patients son and neighbors assist when needed with food to ensure patient does not run out Education provided on Meals on Wheels program - patient agreeable to referral Voice message left on Senior Line at [phone] requesting a return call to place patient on waiting list Discussed patient has an Life Alert but is interested in more affordable options Verbal and written education provided on [place]; flyer mailed for review Scheduled follow up call over the next month to confirm receipt of resource         Our next appointment is by telephone on 3/22 at 11:00  Please call the care guide team at [phone] if you need to cancel or reschedule your appointment.   If you are experiencing a Mental Health or [place] or need someone to talk to, please call 911  The patient verbalized understanding of instructions, educational materials, and care plan provided today and DECLINED offer to receive copy of patient instructions, educational materials, and care plan.   Telephone follow up appointment with care management team member scheduled for:3/22  Daneen Schick, BSW, CDP Social Worker, Certified Dementia Practitioner [facility] Care Management  Care Coordination [phone]

## 2022-03-22 NOTE — Patient Outreach (Signed)
  Care Coordination   Initial Visit Note   03/22/2022 Name: Vincent Byrd MRN: [id number] DOB: [birth year]  Vincent Byrd is a 62 y.o. year old male who sees Sharion Settler, DO for primary care. I spoke with  Vincent Byrd by phone today.  What matters to the patients health and wellness today?  To identify food resources and financial options for life alert    Goals Addressed             This Visit's Progress    Care Coordination Activities       Care Coordination Interventions: Referral received indicating patient is experiencing food insecurity Determined the patient has FNS benefits of $23 per month. 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 Patients son and neighbors assist when needed with food to ensure patient does not run out Education provided on Meals on Wheels program - patient agreeable to referral Voice message left on Senior Line at [phone] requesting a return call to place patient on waiting list Discussed patient has an Building services engineer but is interested in more affordable options Verbal and written education provided on [place]; flyer mailed for review Scheduled follow up call over the next month to confirm receipt of resource         SDOH assessments and interventions completed:  Yes  SDOH Interventions Today    Flowsheet Row Most Recent Value  SDOH Interventions   Food Insecurity Interventions Other (Comment)  [outbound call to Tax adviser for [employer] referral]  Housing Interventions Intervention Not Indicated  Transportation Interventions Intervention Not Indicated  [uses SCAT]  Utilities Interventions Intervention Not Indicated        Care Coordination Interventions:  Yes, provided   Interventions Today    Flowsheet Row Most Recent Value  Chronic Disease   Chronic disease during today's visit Hypertension  (HTN), Other  General Interventions   General Interventions Discussed/Reviewed General Interventions Discussed, Community Resources  [employer] on [employer]  Education Interventions   Education Provided Provided Printed Education  [facility] Med Alert Flyer]        Follow up plan: Follow up call scheduled for 3/22 SW will follow up with Senior [place] over the next week if a return call is not received.    Encounter Outcome:  Pt. Visit Completed   Daneen Schick, BSW, CDP Social Worker, Certified Dementia Practitioner [place] Management  Care Coordination [phone]

## 2022-03-29 ENCOUNTER — Telehealth: Payer: Self-pay | Admitting: *Deleted

## 2022-03-29 NOTE — Telephone Encounter (Signed)
Pt would like a letter from Dr. Nita Sells about his fungus on his feet to take to the [place].  Reports that he needs it to state that he will deal with this the rest of his life.  Unclear ( after many questions) what the purpose of letter is.   Advised I would send message to Dr. Nita Sells but an appt amy be needed. Christen Bame, CMA

## 2022-04-01 NOTE — Telephone Encounter (Signed)
Called patient. Discussed that I am unable to state that his athletes foot/onychomycosis is caused from the showers in the [employer]. He requests that I send records noting his previous treatments for this. Discussed that the [place] has requested records dating back to the 1980s and we will send the records we have (established care here 2016). These records should show his previous treatments for onychomycosis.

## 2022-04-19 ENCOUNTER — Ambulatory Visit: Payer: Self-pay

## 2022-04-19 NOTE — Patient Instructions (Signed)
Visit Information  Thank you for taking time to visit with me today. Please don't hesitate to contact me if I can be of assistance to you.   Following are the goals we discussed today:   Goals Addressed             This Visit's Progress    Care Coordination Activities   On track    Care Coordination Interventions: Collaboration with Kassie Mends with [place] who advises patient is not eligible for meals on wheels due to having an aid that assists with meal preparation. Mrs. Truman Hayward indicates she referred the patient to [employer] for meal support Contacted the patient to assess for goal progression Discussed the patient has received services from [employer] and will receive a grocery delivery on the 3rd Tuesday of every month Determined the patient has yet to receive [place] flyer via mail - resent to patients addressed Discussed plan for SW to follow up over the next month to confirm receipt of resource         Our next appointment is by telephone on 4/17 at 11:30  Please call the care guide team at [phone] if you need to cancel or reschedule your appointment.   If you are experiencing a Mental Health or [place] or need someone to talk to, please call 911  The patient verbalized understanding of instructions, educational materials, and care plan provided today and DECLINED offer to receive copy of patient instructions, educational materials, and care plan.   Telephone follow up appointment with care management team member scheduled for:05/15/22  Daneen Schick, Arita Miss, CDP Social Worker, Certified Dementia Practitioner [place] Management  Care Coordination [phone]

## 2022-04-19 NOTE — Patient Outreach (Signed)
  Care Coordination   Follow Up Visit Note   04/19/2022 Name: Vincent Byrd MRN: [id number] DOB: 08-25-1960  Vincent Byrd is a 62 y.o. year old male who sees Sharion Settler, DO for primary care. I spoke with  Vincent Byrd by phone today.  What matters to the patients health and wellness today?  Resource linking    Goals Addressed             This Visit's Progress    Care Coordination Activities   On track    Care Coordination Interventions: Collaboration with Kassie Mends with [place] who advises patient is not eligible for meals on wheels due to having an aid that assists with meal preparation. Vincent Byrd indicates she referred the patient to [employer] for meal support Contacted the patient to assess for goal progression Discussed the patient has received services from [employer] and will receive a grocery delivery on the 3rd Tuesday of every month Determined the patient has yet to receive [place] flyer via mail - resent to patients addressed Discussed plan for SW to follow up over the next month to confirm receipt of resource         SDOH assessments and interventions completed:  No     Care Coordination Interventions:  Yes, provided   Interventions Today    Flowsheet Row Most Recent Value  Chronic Disease   Chronic disease during today's visit Hypertension (HTN), Other  General Interventions   General Interventions Discussed/Reviewed General Interventions Reviewed, [employer] Resources  Education Interventions   Education Provided Provided Printed Education  [facility] Med Alert Flyer]       Follow up plan: Follow up call scheduled for 05/15/22    Encounter Outcome:  Pt. Visit Completed   Daneen Schick, Arita Miss, CDP Social Worker, Certified Dementia Practitioner [place] Management  Care Coordination [phone]

## 2022-04-27 ENCOUNTER — Other Ambulatory Visit: Payer: Self-pay | Admitting: Family Medicine

## 2022-04-27 DIAGNOSIS — E785 Hyperlipidemia, unspecified: Secondary | ICD-10-CM

## 2022-04-30 ENCOUNTER — Other Ambulatory Visit: Payer: Self-pay | Admitting: Family Medicine

## 2022-05-15 ENCOUNTER — Ambulatory Visit: Payer: Self-pay

## 2022-05-15 NOTE — Patient Outreach (Signed)
  Care Coordination   Follow Up Visit Note   05/15/2022 Name: Vincent Byrd MRN: [id number] DOB: [birth year]  Vincent Byrd is a 62 y.o. year old male who sees Sabino Dick, DO for primary care. I spoke with  Vincent Byrd by phone today.  What matters to the patients health and wellness today?  No acute challenges identified during today's outreach    Goals Addressed             This Visit's Progress    Care Coordination Activities       Care Coordination Interventions: Confirmed receipt of [facility] Med Alert Flyer Discussed  Contacted the patient to assess for goal progression Discussed the patient has received services from [employer] and will receive a grocery delivery on the 3rd Tuesday of every month Determined the patient has yet to receive [facility] Med Alert flyer via mail - resent to patients addressed Discussed plan for SW to follow up over the next month to confirm receipt of resource         SDOH assessments and interventions completed:  Yes  SDOH Interventions Today    Flowsheet Row Most Recent Value  SDOH Interventions   Food Insecurity Interventions Intervention Not Indicated  [pt has received services from [employer]]        Care Coordination Interventions:  Yes, provided   Interventions Today    Flowsheet Row Most Recent Value  Chronic Disease   Chronic disease during today's visit Hypertension (HTN)  General Interventions   General Interventions Discussed/Reviewed General Interventions Reviewed, Community Resources        Follow up plan: No further intervention required.   Encounter Outcome:  Pt. Visit Completed   Bevelyn Ngo, BSW, CDP Social Worker, Certified Dementia Practitioner [facility] Care Management  Care Coordination [phone]

## 2022-05-15 NOTE — Patient Instructions (Signed)
Visit Information  Thank you for taking time to visit with me today. Please don't hesitate to contact me if I can be of assistance to you.   Following are the goals we discussed today:   Goals Addressed             This Visit's Progress    Care Coordination Activities       Care Coordination Interventions: Confirmed receipt of [facility] Med Alert Flyer Discussed  Contacted the patient to assess for goal progression Discussed the patient has received services from [employer] and will receive a grocery delivery on the 3rd Tuesday of every month Determined the patient has yet to receive [facility] Med Alert flyer via mail - resent to patients addressed Discussed plan for SW to follow up over the next month to confirm receipt of resource         If you are experiencing a Mental Health or Behavioral Health Crisis or need someone to talk to, please call 911  The patient verbalized understanding of instructions, educational materials, and care plan provided today and DECLINED offer to receive copy of patient instructions, educational materials, and care plan.   No further follow up required: Please contact your primary care provider as needed  Bevelyn Ngo, Kenard Gower, CDP Social Worker, Certified Dementia Practitioner [facility] Care Management  Care Coordination [phone]

## 2022-05-23 ENCOUNTER — Encounter: Payer: Self-pay | Admitting: Family Medicine

## 2022-05-23 ENCOUNTER — Ambulatory Visit (INDEPENDENT_AMBULATORY_CARE_PROVIDER_SITE_OTHER): Payer: Medicare HMO | Admitting: Family Medicine

## 2022-05-23 ENCOUNTER — Other Ambulatory Visit: Payer: Self-pay

## 2022-05-23 VITALS — BP 129/62 | HR 64 | Ht 68.0 in

## 2022-05-23 DIAGNOSIS — Z13220 Encounter for screening for lipoid disorders: Secondary | ICD-10-CM | POA: Diagnosis not present

## 2022-05-23 DIAGNOSIS — K5901 Slow transit constipation: Secondary | ICD-10-CM

## 2022-05-23 DIAGNOSIS — Z720 Tobacco use: Secondary | ICD-10-CM | POA: Diagnosis not present

## 2022-05-23 DIAGNOSIS — I10 Essential (primary) hypertension: Secondary | ICD-10-CM | POA: Diagnosis not present

## 2022-05-23 NOTE — Patient Instructions (Addendum)
It was wonderful to see you today.  Please bring ALL of your medications with you to every visit.   Today we talked about:  We are doing labs today to check your cholesterol. All your other labs are up to date.   I have provided you with an Advanced Directive form.  You can bring it back here to have it notarized.   Continue your efforts to cut back smoking! We will work on getting your lung CT scheduled.   For your constipation: increase your MiraLAX to twice a day. You can also increase to two cap-fulls if no improvement. Try to increase your fiber intake and water intake.   Thank you for coming to your visit as scheduled. We have had a large "no-show" problem lately, and this significantly limits our ability to see and care for patients. As a friendly reminder- if you cannot make your appointment please call to cancel. We do have a no show policy for those who do not cancel within 24 hours. Our policy is that if you miss or fail to cancel an appointment within 24 hours, 3 times in a 36-month period, you may be dismissed from our clinic.   Thank you for choosing [facility].   Please call [phone] with any questions about today's appointment.  Please be sure to schedule follow up at the front  desk before you leave today.   Sabino Dick, DO PGY-3 Family Medicine

## 2022-05-23 NOTE — Progress Notes (Signed)
    SUBJECTIVE:   CHIEF COMPLAINT / HPI:   Vincent Byrd is a 62 y.o. male who presents to the [facility] clinic today to discuss the following concerns:   Tobacco Use Disorder Smoking 1ppd, states that he has some life stressors that have caused him to pick up smoking. Would like to defer CT chest for lung cancer screening until June when his son gets out of school. Still has some nicotine patches. He has been praying a lot, and feels like this will help him to stop over any other medications.   Constipation States that he can go 1-2 weeks without having a BM. This has been a long-standing problem for him. He was advised that his lack of mobility is likely contributing. He also endorses that he doesn't eat vegetables "like I should". He eats mostly meats. He isn't sure if he has blood in his stool. Last colonoscopy 08/2021 was good- recommended to return in 10 years. He has been trying to drink water. He states that the generic MiraLAX doesn't help him. He states prune juice makes him gassy.    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 Lipid screening Goal LDL <70.  - Lipid Panel - Continue Atorvastatin 40 mg daily   2. Tobacco abuse Does not want assistance with any other medications at this time. Would like to continue to use prayer and the patches he has. He is also motivated financially to stop smoking. Re-ordered CT of chest for lung cancer screening and will have CMA schedule for June per patient  preference.  - CT CHEST LUNG CA SCREEN LOW DOSE W/O CM; Future  3. Slow transit constipation With reassuring colonoscopy in August. Has been long-standing problem for patient.  -Increase   4. Primary hypertension At goal. On Amlodipine 5 mg. BMP UTD.   Advanced directives provided to patient today.   Sabino Dick, DO [place] [facility]

## 2022-05-24 ENCOUNTER — Telehealth: Payer: Self-pay

## 2022-05-24 ENCOUNTER — Encounter: Payer: Self-pay | Admitting: Family Medicine

## 2022-05-24 LAB — LIPID PANEL
Chol/HDL Ratio: 1.6 ratio (ref 0.0–5.0)
Cholesterol, Total: 98 mg/dL — ABNORMAL LOW (ref 100–199)
HDL: 60 mg/dL (ref >39)
LDL Chol Calc (NIH): 27 mg/dL (ref 0–99)
Triglycerides: 42 mg/dL (ref 0–149)
VLDL Cholesterol Cal: 11 mg/dL (ref 5–40)

## 2022-05-24 NOTE — Telephone Encounter (Signed)
-----   Message from Sabino Dick, DO sent at 05/24/2022  8:42 AM EDT ----- Regarding: Schedule Chest CT Hi Team,   When you have a moment could you please schedule Vincent Byrd chest CT sometime in June (per his preference)?  Thank you! Ale

## 2022-05-24 NOTE — Telephone Encounter (Signed)
Schedule patient's CT on 08/05/22 at 10:40 with [facility], I called patient to informed him of the appointment, since he is not connected to Mychart. Penni Bombard CMA

## 2022-08-05 ENCOUNTER — Ambulatory Visit (HOSPITAL_COMMUNITY)
Admission: RE | Admit: 2022-08-05 | Discharge: 2022-08-05 | Disposition: A | Discharge status: Home / Self Care | Payer: Medicare HMO | Source: Ambulatory Visit | Attending: Family Medicine | Admitting: Family Medicine

## 2022-08-05 DIAGNOSIS — Z122 Encounter for screening for malignant neoplasm of respiratory organs: Secondary | ICD-10-CM | POA: Diagnosis not present

## 2022-08-05 DIAGNOSIS — J432 Centrilobular emphysema: Secondary | ICD-10-CM | POA: Diagnosis not present

## 2022-08-05 DIAGNOSIS — I700 Atherosclerosis of aorta: Secondary | ICD-10-CM | POA: Diagnosis not present

## 2022-08-05 DIAGNOSIS — I2510 Atherosclerotic heart disease of native coronary artery without angina pectoris: Secondary | ICD-10-CM | POA: Diagnosis not present

## 2022-08-05 DIAGNOSIS — F17210 Nicotine dependence, cigarettes, uncomplicated: Secondary | ICD-10-CM | POA: Diagnosis not present

## 2022-08-05 DIAGNOSIS — Z720 Tobacco use: Secondary | ICD-10-CM | POA: Insufficient documentation

## 2022-08-08 ENCOUNTER — Encounter: Payer: Self-pay | Admitting: Family Medicine

## 2022-08-09 ENCOUNTER — Telehealth: Payer: Self-pay

## 2022-08-09 NOTE — Telephone Encounter (Signed)
Patient calls nurse line requesting CT Chest results.   Results discussed with patient and patient advised a letter was sent to his home.   Patient appreciative.

## 2022-10-17 ENCOUNTER — Ambulatory Visit: Payer: Medicare HMO | Admitting: Student

## 2022-10-18 ENCOUNTER — Other Ambulatory Visit: Payer: Self-pay

## 2022-10-18 MED ORDER — KETOCONAZOLE 2 % EX CREA: TOPICAL_CREAM | Freq: Every day | CUTANEOUS | 2 refills | Status: DC

## 2022-10-18 NOTE — Progress Notes (Unsigned)
    SUBJECTIVE:   CHIEF COMPLAINT / HPI:   Weight loss Patient reports he lost some weight after recent illness about 3 weeks ago. States he is using two cushions in his wheelchair due to feeling "bony". Has not been weighed recently. Requesting Prednisone to help with weight gain as it makes him hungry. Reports his normal appetite is back now. Denies night sweats. UTD on cancer screening. Likes Boost.  Post nasal drip Reports it always happens this time of year with the season change. Has used Flonase previously and it helps. Requesting mouthwash to help with throat irritation.  Weight gain - Boost. Mirtipine  Tobacco use, COPD Smoke 3/4 pack day.  Hypertension: - Medications: Amlodipine 5mg  - Compliance: Yes. 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 Respiratory: CTAB, normal effort, No wheezes, rales or rhonchi Abdomen: Bowel sounds present, nontender, nondistended Extremities: no edema or cyanosis. Skin: warm and dry. No rashes noted Neuro: alert, no obvious focal deficits Psych: Normal affect and mood MSK: L wrist and finger contracted with limited movement. L shoulder with ~60 degrees of movement, 3/5 strength. Unable to flex L hip. L knee extension ~ 60 degrees.  ASSESSMENT/PLAN:   Assessment & Plan Primary hypertension 139/80, did not take medication last night and smoked a cigarette just prior to visit. Recommend continuing Amlodipine 5mg  daily and checking BP with log. Anxiety Difficulty sleeping, would like to try medication for support. -Trial Mirtazapine 7.5mg  nightly given concomitant desire to increase appetite Tobacco use 3/4 pack per day currently. UTD on lung cancer screening.  Declines additional assistance with cessation -Refill Nicotine patches Hemiplegia, unspecified etiology, unspecified hemiplegia type, unspecified laterality (HCC) Significant contracture of L wrist causing discomfort in addition to near total loss of use of left side. Self transfers from motorized wheelchair. Would benefit from home PT to maintain strength and independence. -Home Health PT ordered Weight loss Due to patient preference and time constraints with transportation, unable to weigh patient today. Per patient weight loss in the setting of illness, appetite has now returned to normal and he wants to regain the weight. No red flags and UTD on cancer screenings. -Utilize Boost in addition to regular meal -Obtain patient weight at follow up visit Post-nasal drip Throat irritation most likely secondary to seasonal allergies and postnasal drip. -Flonase 2 sprays in each nostril daily Tinea pedis, unspecified laterality Intermittent, symptomatic relief with Ketoconazole cream, refilled.    Dr. Elberta Fortis, DO [facility]

## 2022-10-22 ENCOUNTER — Encounter: Payer: Self-pay | Admitting: Family Medicine

## 2022-10-22 ENCOUNTER — Ambulatory Visit: Payer: Medicare HMO | Admitting: Family Medicine

## 2022-10-22 VITALS — BP 139/80 | HR 77 | Temp 98.4°F

## 2022-10-22 DIAGNOSIS — Z720 Tobacco use: Secondary | ICD-10-CM | POA: Diagnosis not present

## 2022-10-22 DIAGNOSIS — Z23 Encounter for immunization: Secondary | ICD-10-CM | POA: Diagnosis not present

## 2022-10-22 DIAGNOSIS — F419 Anxiety disorder, unspecified: Secondary | ICD-10-CM | POA: Diagnosis not present

## 2022-10-22 DIAGNOSIS — R0982 Postnasal drip: Secondary | ICD-10-CM

## 2022-10-22 DIAGNOSIS — I10 Essential (primary) hypertension: Secondary | ICD-10-CM | POA: Diagnosis not present

## 2022-10-22 DIAGNOSIS — G8190 Hemiplegia, unspecified affecting unspecified side: Secondary | ICD-10-CM | POA: Diagnosis not present

## 2022-10-22 DIAGNOSIS — R634 Abnormal weight loss: Secondary | ICD-10-CM

## 2022-10-22 DIAGNOSIS — B353 Tinea pedis: Secondary | ICD-10-CM | POA: Diagnosis not present

## 2022-10-22 LAB — POCT GLYCOSYLATED HEMOGLOBIN (HGB A1C): HbA1c, POC (controlled diabetic range): 5.4 % (ref 0.0–7.0)

## 2022-10-22 MED ORDER — FLUTICASONE PROPIONATE 50 MCG/ACT NA SUSP
2.0000 | Freq: Every day | NASAL | 1 refills | Status: AC
Start: 2022-10-22 — End: ?

## 2022-10-22 MED ORDER — NICOTINE 21 MG/24HR TD PT24: 21.0000 mg | MEDICATED_PATCH | Freq: Every day | TRANSDERMAL | 0 refills | Status: DC

## 2022-10-22 MED ORDER — MIRTAZAPINE 7.5 MG PO TABS
7.5000 mg | ORAL_TABLET | Freq: Every day | ORAL | 2 refills | Status: DC
Start: 2022-10-22 — End: 2023-11-27

## 2022-10-22 MED ORDER — KETOCONAZOLE 2 % EX CREA
TOPICAL_CREAM | Freq: Every day | CUTANEOUS | 2 refills | Status: DC
Start: 2022-10-22 — End: 2023-11-27

## 2022-10-22 NOTE — Assessment & Plan Note (Signed)
Difficulty sleeping, would like to try medication for support. -Trial Mirtazapine 7.5mg  nightly given concomitant desire to increase appetite

## 2022-10-22 NOTE — Assessment & Plan Note (Signed)
139/80, did not take medication last night and smoked a cigarette just prior to visit. Recommend continuing Amlodipine 5mg  daily and checking BP with log.

## 2022-10-22 NOTE — Patient Instructions (Addendum)
It was wonderful to see you today! Thank you for choosing [facility].   Please bring ALL of your medications with you to every visit.   Today we talked about:  Please take the mirtazapine every night for the next couple of weeks, it should help with sleep and appetite stimulation.  You can also drink the boost for the next couple of weeks to give you extra calories. For the irritation in your throat please use the Flonase 2 sprays in each nostril daily when you are having symptoms.  Please angle the nasal spray towards your nose on each side. I refilled the ketoconazole and the nicotine patches for you to use. Please continue to keep taking her amlodipine and check your blood pressure at home if possible.  Additionally quitting smoking will help with your blood pressure management. I ordered home health physical therapy to help with your left-sided weakness and specifically with your left wrist to increase your ability to transfer.  Please follow up in 1 month  If you haven't already, sign up for My Chart to have easy access to your labs results, and communication with your primary care physician.   We are checking some labs today. If they are abnormal, I will call you. If they are normal, I will send you a MyChart message (if it is active) or a letter in the mail. If you do not hear about your labs in the next 2 weeks, please call the office.  Call the clinic at [phone] if your symptoms worsen or you have any concerns.  Please be sure to schedule follow up at the front desk before you leave today.   Elberta Fortis, DO Family Medicine

## 2022-10-23 LAB — CBC WITH DIFFERENTIAL/PLATELET
Basophils Absolute: 0 10*3/uL (ref 0.0–0.2)
Basos: 0 %
EOS (ABSOLUTE): 0 10*3/uL (ref 0.0–0.4)
Eos: 0 %
Hematocrit: 48.2 % (ref 37.5–51.0)
Hemoglobin: 15.7 g/dL (ref 13.0–17.7)
Immature Grans (Abs): 0 10*3/uL (ref 0.0–0.1)
Immature Granulocytes: 0 %
Lymphocytes Absolute: 1 10*3/uL (ref 0.7–3.1)
Lymphs: 39 %
MCH: 29 pg (ref 26.6–33.0)
MCHC: 32.6 g/dL (ref 31.5–35.7)
MCV: 89 fL (ref 79–97)
Monocytes Absolute: 0.3 10*3/uL (ref 0.1–0.9)
Monocytes: 12 %
Neutrophils Absolute: 1.3 10*3/uL — ABNORMAL LOW (ref 1.4–7.0)
Neutrophils: 49 %
Platelets: 174 10*3/uL (ref 150–450)
RBC: 5.42 x10E6/uL (ref 4.14–5.80)
RDW: 13.3 % (ref 11.6–15.4)
WBC: 2.7 10*3/uL — ABNORMAL LOW (ref 3.4–10.8)

## 2022-10-23 LAB — BASIC METABOLIC PANEL
BUN/Creatinine Ratio: 8 — ABNORMAL LOW (ref 10–24)
BUN: 7 mg/dL — ABNORMAL LOW (ref 8–27)
CO2: 25 mmol/L (ref 20–29)
Calcium: 9.3 mg/dL (ref 8.6–10.2)
Chloride: 101 mmol/L (ref 96–106)
Creatinine, Ser: 0.84 mg/dL (ref 0.76–1.27)
Glucose: 80 mg/dL (ref 70–99)
Potassium: 3.8 mmol/L (ref 3.5–5.2)
Sodium: 140 mmol/L (ref 134–144)
eGFR: 99 mL/min/{1.73_m2} (ref >59)

## 2022-10-24 ENCOUNTER — Encounter: Payer: Self-pay | Admitting: Family Medicine

## 2022-10-27 DIAGNOSIS — J449 Chronic obstructive pulmonary disease, unspecified: Secondary | ICD-10-CM | POA: Diagnosis not present

## 2022-10-27 DIAGNOSIS — I10 Essential (primary) hypertension: Secondary | ICD-10-CM | POA: Diagnosis not present

## 2022-10-27 DIAGNOSIS — F419 Anxiety disorder, unspecified: Secondary | ICD-10-CM | POA: Diagnosis not present

## 2022-10-27 DIAGNOSIS — R634 Abnormal weight loss: Secondary | ICD-10-CM | POA: Diagnosis not present

## 2022-10-27 DIAGNOSIS — I69354 Hemiplegia and hemiparesis following cerebral infarction affecting left non-dominant side: Secondary | ICD-10-CM | POA: Diagnosis not present

## 2022-10-27 DIAGNOSIS — E785 Hyperlipidemia, unspecified: Secondary | ICD-10-CM | POA: Diagnosis not present

## 2022-10-27 DIAGNOSIS — B353 Tinea pedis: Secondary | ICD-10-CM | POA: Diagnosis not present

## 2022-10-27 DIAGNOSIS — E46 Unspecified protein-calorie malnutrition: Secondary | ICD-10-CM | POA: Diagnosis not present

## 2022-10-27 DIAGNOSIS — E119 Type 2 diabetes mellitus without complications: Secondary | ICD-10-CM | POA: Diagnosis not present

## 2022-11-08 DIAGNOSIS — E46 Unspecified protein-calorie malnutrition: Secondary | ICD-10-CM | POA: Diagnosis not present

## 2022-11-08 DIAGNOSIS — R634 Abnormal weight loss: Secondary | ICD-10-CM | POA: Diagnosis not present

## 2022-11-08 DIAGNOSIS — F419 Anxiety disorder, unspecified: Secondary | ICD-10-CM | POA: Diagnosis not present

## 2022-11-08 DIAGNOSIS — B353 Tinea pedis: Secondary | ICD-10-CM | POA: Diagnosis not present

## 2022-11-08 DIAGNOSIS — E119 Type 2 diabetes mellitus without complications: Secondary | ICD-10-CM | POA: Diagnosis not present

## 2022-11-08 DIAGNOSIS — I10 Essential (primary) hypertension: Secondary | ICD-10-CM | POA: Diagnosis not present

## 2022-11-08 DIAGNOSIS — E785 Hyperlipidemia, unspecified: Secondary | ICD-10-CM | POA: Diagnosis not present

## 2022-11-08 DIAGNOSIS — J449 Chronic obstructive pulmonary disease, unspecified: Secondary | ICD-10-CM | POA: Diagnosis not present

## 2022-11-08 DIAGNOSIS — I69354 Hemiplegia and hemiparesis following cerebral infarction affecting left non-dominant side: Secondary | ICD-10-CM | POA: Diagnosis not present

## 2022-11-11 DIAGNOSIS — R634 Abnormal weight loss: Secondary | ICD-10-CM | POA: Diagnosis not present

## 2022-11-11 DIAGNOSIS — E119 Type 2 diabetes mellitus without complications: Secondary | ICD-10-CM | POA: Diagnosis not present

## 2022-11-11 DIAGNOSIS — I69354 Hemiplegia and hemiparesis following cerebral infarction affecting left non-dominant side: Secondary | ICD-10-CM | POA: Diagnosis not present

## 2022-11-11 DIAGNOSIS — E785 Hyperlipidemia, unspecified: Secondary | ICD-10-CM | POA: Diagnosis not present

## 2022-11-11 DIAGNOSIS — I10 Essential (primary) hypertension: Secondary | ICD-10-CM | POA: Diagnosis not present

## 2022-11-11 DIAGNOSIS — J449 Chronic obstructive pulmonary disease, unspecified: Secondary | ICD-10-CM | POA: Diagnosis not present

## 2022-11-11 DIAGNOSIS — F419 Anxiety disorder, unspecified: Secondary | ICD-10-CM | POA: Diagnosis not present

## 2022-11-11 DIAGNOSIS — E46 Unspecified protein-calorie malnutrition: Secondary | ICD-10-CM | POA: Diagnosis not present

## 2022-11-11 DIAGNOSIS — B353 Tinea pedis: Secondary | ICD-10-CM | POA: Diagnosis not present

## 2022-11-18 ENCOUNTER — Telehealth: Payer: Self-pay

## 2022-11-18 DIAGNOSIS — E785 Hyperlipidemia, unspecified: Secondary | ICD-10-CM | POA: Diagnosis not present

## 2022-11-18 DIAGNOSIS — R634 Abnormal weight loss: Secondary | ICD-10-CM | POA: Diagnosis not present

## 2022-11-18 DIAGNOSIS — E119 Type 2 diabetes mellitus without complications: Secondary | ICD-10-CM | POA: Diagnosis not present

## 2022-11-18 DIAGNOSIS — F419 Anxiety disorder, unspecified: Secondary | ICD-10-CM | POA: Diagnosis not present

## 2022-11-18 DIAGNOSIS — I10 Essential (primary) hypertension: Secondary | ICD-10-CM | POA: Diagnosis not present

## 2022-11-18 DIAGNOSIS — E46 Unspecified protein-calorie malnutrition: Secondary | ICD-10-CM | POA: Diagnosis not present

## 2022-11-18 DIAGNOSIS — J449 Chronic obstructive pulmonary disease, unspecified: Secondary | ICD-10-CM | POA: Diagnosis not present

## 2022-11-18 DIAGNOSIS — B353 Tinea pedis: Secondary | ICD-10-CM | POA: Diagnosis not present

## 2022-11-18 DIAGNOSIS — I69354 Hemiplegia and hemiparesis following cerebral infarction affecting left non-dominant side: Secondary | ICD-10-CM | POA: Diagnosis not present

## 2022-11-18 NOTE — Telephone Encounter (Signed)
Colletta Maryland from [facility] calling for OT verbal orders as follows:  1 time(s) weekly for 6 week(s)  Verbal orders given per [facility] protocol  Talbot Grumbling, RN

## 2022-11-19 DIAGNOSIS — R634 Abnormal weight loss: Secondary | ICD-10-CM | POA: Diagnosis not present

## 2022-11-19 DIAGNOSIS — E785 Hyperlipidemia, unspecified: Secondary | ICD-10-CM | POA: Diagnosis not present

## 2022-11-19 DIAGNOSIS — B353 Tinea pedis: Secondary | ICD-10-CM | POA: Diagnosis not present

## 2022-11-19 DIAGNOSIS — J449 Chronic obstructive pulmonary disease, unspecified: Secondary | ICD-10-CM | POA: Diagnosis not present

## 2022-11-19 DIAGNOSIS — I69354 Hemiplegia and hemiparesis following cerebral infarction affecting left non-dominant side: Secondary | ICD-10-CM | POA: Diagnosis not present

## 2022-11-19 DIAGNOSIS — E46 Unspecified protein-calorie malnutrition: Secondary | ICD-10-CM | POA: Diagnosis not present

## 2022-11-19 DIAGNOSIS — E119 Type 2 diabetes mellitus without complications: Secondary | ICD-10-CM | POA: Diagnosis not present

## 2022-11-19 DIAGNOSIS — F419 Anxiety disorder, unspecified: Secondary | ICD-10-CM | POA: Diagnosis not present

## 2022-11-19 DIAGNOSIS — I10 Essential (primary) hypertension: Secondary | ICD-10-CM | POA: Diagnosis not present

## 2022-11-26 DIAGNOSIS — E119 Type 2 diabetes mellitus without complications: Secondary | ICD-10-CM | POA: Diagnosis not present

## 2022-11-26 DIAGNOSIS — J449 Chronic obstructive pulmonary disease, unspecified: Secondary | ICD-10-CM | POA: Diagnosis not present

## 2022-11-26 DIAGNOSIS — I10 Essential (primary) hypertension: Secondary | ICD-10-CM | POA: Diagnosis not present

## 2022-11-26 DIAGNOSIS — R634 Abnormal weight loss: Secondary | ICD-10-CM | POA: Diagnosis not present

## 2022-11-26 DIAGNOSIS — E785 Hyperlipidemia, unspecified: Secondary | ICD-10-CM | POA: Diagnosis not present

## 2022-11-26 DIAGNOSIS — I69354 Hemiplegia and hemiparesis following cerebral infarction affecting left non-dominant side: Secondary | ICD-10-CM | POA: Diagnosis not present

## 2022-11-26 DIAGNOSIS — E46 Unspecified protein-calorie malnutrition: Secondary | ICD-10-CM | POA: Diagnosis not present

## 2022-11-26 DIAGNOSIS — B353 Tinea pedis: Secondary | ICD-10-CM | POA: Diagnosis not present

## 2022-11-26 DIAGNOSIS — F419 Anxiety disorder, unspecified: Secondary | ICD-10-CM | POA: Diagnosis not present

## 2022-12-04 DIAGNOSIS — I69354 Hemiplegia and hemiparesis following cerebral infarction affecting left non-dominant side: Secondary | ICD-10-CM | POA: Diagnosis not present

## 2022-12-04 DIAGNOSIS — E785 Hyperlipidemia, unspecified: Secondary | ICD-10-CM | POA: Diagnosis not present

## 2022-12-04 DIAGNOSIS — J449 Chronic obstructive pulmonary disease, unspecified: Secondary | ICD-10-CM | POA: Diagnosis not present

## 2022-12-04 DIAGNOSIS — E119 Type 2 diabetes mellitus without complications: Secondary | ICD-10-CM | POA: Diagnosis not present

## 2022-12-04 DIAGNOSIS — I10 Essential (primary) hypertension: Secondary | ICD-10-CM | POA: Diagnosis not present

## 2022-12-04 DIAGNOSIS — R634 Abnormal weight loss: Secondary | ICD-10-CM | POA: Diagnosis not present

## 2022-12-04 DIAGNOSIS — E46 Unspecified protein-calorie malnutrition: Secondary | ICD-10-CM | POA: Diagnosis not present

## 2022-12-04 DIAGNOSIS — F419 Anxiety disorder, unspecified: Secondary | ICD-10-CM | POA: Diagnosis not present

## 2022-12-04 DIAGNOSIS — B353 Tinea pedis: Secondary | ICD-10-CM | POA: Diagnosis not present

## 2022-12-05 ENCOUNTER — Telehealth: Payer: Self-pay

## 2022-12-05 DIAGNOSIS — E119 Type 2 diabetes mellitus without complications: Secondary | ICD-10-CM | POA: Diagnosis not present

## 2022-12-05 DIAGNOSIS — E46 Unspecified protein-calorie malnutrition: Secondary | ICD-10-CM | POA: Diagnosis not present

## 2022-12-05 DIAGNOSIS — B353 Tinea pedis: Secondary | ICD-10-CM | POA: Diagnosis not present

## 2022-12-05 DIAGNOSIS — R634 Abnormal weight loss: Secondary | ICD-10-CM | POA: Diagnosis not present

## 2022-12-05 DIAGNOSIS — E785 Hyperlipidemia, unspecified: Secondary | ICD-10-CM | POA: Diagnosis not present

## 2022-12-05 DIAGNOSIS — I69354 Hemiplegia and hemiparesis following cerebral infarction affecting left non-dominant side: Secondary | ICD-10-CM | POA: Diagnosis not present

## 2022-12-05 DIAGNOSIS — F419 Anxiety disorder, unspecified: Secondary | ICD-10-CM | POA: Diagnosis not present

## 2022-12-05 DIAGNOSIS — I10 Essential (primary) hypertension: Secondary | ICD-10-CM | POA: Diagnosis not present

## 2022-12-05 DIAGNOSIS — J449 Chronic obstructive pulmonary disease, unspecified: Secondary | ICD-10-CM | POA: Diagnosis not present

## 2022-12-05 NOTE — Telephone Encounter (Signed)
Patient calls nurse line in regards to renewal of [employer] forms.   He reports he will fill out his portion and mail the forms to our office for PCP completion.   Will forward to PCP to make aware.

## 2022-12-11 DIAGNOSIS — B353 Tinea pedis: Secondary | ICD-10-CM | POA: Diagnosis not present

## 2022-12-11 DIAGNOSIS — J449 Chronic obstructive pulmonary disease, unspecified: Secondary | ICD-10-CM | POA: Diagnosis not present

## 2022-12-11 DIAGNOSIS — I69354 Hemiplegia and hemiparesis following cerebral infarction affecting left non-dominant side: Secondary | ICD-10-CM | POA: Diagnosis not present

## 2022-12-11 DIAGNOSIS — E785 Hyperlipidemia, unspecified: Secondary | ICD-10-CM | POA: Diagnosis not present

## 2022-12-11 DIAGNOSIS — F419 Anxiety disorder, unspecified: Secondary | ICD-10-CM | POA: Diagnosis not present

## 2022-12-11 DIAGNOSIS — I10 Essential (primary) hypertension: Secondary | ICD-10-CM | POA: Diagnosis not present

## 2022-12-11 DIAGNOSIS — E119 Type 2 diabetes mellitus without complications: Secondary | ICD-10-CM | POA: Diagnosis not present

## 2022-12-11 DIAGNOSIS — E46 Unspecified protein-calorie malnutrition: Secondary | ICD-10-CM | POA: Diagnosis not present

## 2022-12-11 DIAGNOSIS — R634 Abnormal weight loss: Secondary | ICD-10-CM | POA: Diagnosis not present

## 2022-12-12 DIAGNOSIS — E119 Type 2 diabetes mellitus without complications: Secondary | ICD-10-CM | POA: Diagnosis not present

## 2022-12-12 DIAGNOSIS — R634 Abnormal weight loss: Secondary | ICD-10-CM | POA: Diagnosis not present

## 2022-12-12 DIAGNOSIS — I10 Essential (primary) hypertension: Secondary | ICD-10-CM | POA: Diagnosis not present

## 2022-12-12 DIAGNOSIS — F419 Anxiety disorder, unspecified: Secondary | ICD-10-CM | POA: Diagnosis not present

## 2022-12-12 DIAGNOSIS — E785 Hyperlipidemia, unspecified: Secondary | ICD-10-CM | POA: Diagnosis not present

## 2022-12-12 DIAGNOSIS — I69354 Hemiplegia and hemiparesis following cerebral infarction affecting left non-dominant side: Secondary | ICD-10-CM | POA: Diagnosis not present

## 2022-12-12 DIAGNOSIS — E46 Unspecified protein-calorie malnutrition: Secondary | ICD-10-CM | POA: Diagnosis not present

## 2022-12-12 DIAGNOSIS — J449 Chronic obstructive pulmonary disease, unspecified: Secondary | ICD-10-CM | POA: Diagnosis not present

## 2022-12-12 DIAGNOSIS — B353 Tinea pedis: Secondary | ICD-10-CM | POA: Diagnosis not present

## 2022-12-16 ENCOUNTER — Telehealth: Payer: Self-pay

## 2022-12-16 DIAGNOSIS — F419 Anxiety disorder, unspecified: Secondary | ICD-10-CM | POA: Diagnosis not present

## 2022-12-16 DIAGNOSIS — R634 Abnormal weight loss: Secondary | ICD-10-CM | POA: Diagnosis not present

## 2022-12-16 DIAGNOSIS — I10 Essential (primary) hypertension: Secondary | ICD-10-CM | POA: Diagnosis not present

## 2022-12-16 DIAGNOSIS — E46 Unspecified protein-calorie malnutrition: Secondary | ICD-10-CM | POA: Diagnosis not present

## 2022-12-16 DIAGNOSIS — J449 Chronic obstructive pulmonary disease, unspecified: Secondary | ICD-10-CM | POA: Diagnosis not present

## 2022-12-16 DIAGNOSIS — E119 Type 2 diabetes mellitus without complications: Secondary | ICD-10-CM | POA: Diagnosis not present

## 2022-12-16 DIAGNOSIS — B353 Tinea pedis: Secondary | ICD-10-CM | POA: Diagnosis not present

## 2022-12-16 DIAGNOSIS — I69354 Hemiplegia and hemiparesis following cerebral infarction affecting left non-dominant side: Secondary | ICD-10-CM | POA: Diagnosis not present

## 2022-12-16 DIAGNOSIS — E785 Hyperlipidemia, unspecified: Secondary | ICD-10-CM | POA: Diagnosis not present

## 2022-12-16 NOTE — Telephone Encounter (Signed)
Received call from Bono, OT at [facility] regarding patient.   She reports that she has ordered a left resting hand split through [employer]. We will be receiving this order and provider will need to sign paperwork.  She reports that patient is having increased congestion. Reports that on arrival today, SpO2 was 88% on RA. Throughout visit he improved to 90%. He was not having any SHOB or difficulty breathing. OT asked that I call patient to evaluate further and possibly schedule appointment.    Called patient. Patient did not answer, LVM asking that he return my call.   Veronda Prude, RN

## 2022-12-16 NOTE — Telephone Encounter (Signed)
Patient returns call to nurse line.   We dicussed the SpO2 of 88% today during therapy. He reports he doesn't feel like her reading was correct. He states he has home health come in and out all week and their machines "all read different."   He denies any episodes of being short of breath. He denies any episodes of difficulty breathing. He was speaking in full sentences.   He reports he "always" has some congestion due to smoking.   He declines to make an apt at this time.   Precautions discussed with patient and he appreciates [place] calling to check on him.

## 2022-12-19 NOTE — Telephone Encounter (Signed)
Faxed forms to [employer] per patient request.   Copy made and placed in batch scanning.   Veronda Prude, RN

## 2022-12-23 DIAGNOSIS — F419 Anxiety disorder, unspecified: Secondary | ICD-10-CM | POA: Diagnosis not present

## 2022-12-23 DIAGNOSIS — E785 Hyperlipidemia, unspecified: Secondary | ICD-10-CM | POA: Diagnosis not present

## 2022-12-23 DIAGNOSIS — E46 Unspecified protein-calorie malnutrition: Secondary | ICD-10-CM | POA: Diagnosis not present

## 2022-12-23 DIAGNOSIS — R634 Abnormal weight loss: Secondary | ICD-10-CM | POA: Diagnosis not present

## 2022-12-23 DIAGNOSIS — I69354 Hemiplegia and hemiparesis following cerebral infarction affecting left non-dominant side: Secondary | ICD-10-CM | POA: Diagnosis not present

## 2022-12-23 DIAGNOSIS — B353 Tinea pedis: Secondary | ICD-10-CM | POA: Diagnosis not present

## 2022-12-23 DIAGNOSIS — E119 Type 2 diabetes mellitus without complications: Secondary | ICD-10-CM | POA: Diagnosis not present

## 2022-12-23 DIAGNOSIS — J449 Chronic obstructive pulmonary disease, unspecified: Secondary | ICD-10-CM | POA: Diagnosis not present

## 2022-12-23 DIAGNOSIS — I10 Essential (primary) hypertension: Secondary | ICD-10-CM | POA: Diagnosis not present

## 2022-12-30 DIAGNOSIS — B353 Tinea pedis: Secondary | ICD-10-CM | POA: Diagnosis not present

## 2022-12-30 DIAGNOSIS — E785 Hyperlipidemia, unspecified: Secondary | ICD-10-CM | POA: Diagnosis not present

## 2022-12-30 DIAGNOSIS — I10 Essential (primary) hypertension: Secondary | ICD-10-CM | POA: Diagnosis not present

## 2022-12-30 DIAGNOSIS — E119 Type 2 diabetes mellitus without complications: Secondary | ICD-10-CM | POA: Diagnosis not present

## 2022-12-30 DIAGNOSIS — F419 Anxiety disorder, unspecified: Secondary | ICD-10-CM | POA: Diagnosis not present

## 2022-12-30 DIAGNOSIS — R634 Abnormal weight loss: Secondary | ICD-10-CM | POA: Diagnosis not present

## 2022-12-30 DIAGNOSIS — E46 Unspecified protein-calorie malnutrition: Secondary | ICD-10-CM | POA: Diagnosis not present

## 2022-12-30 DIAGNOSIS — I69354 Hemiplegia and hemiparesis following cerebral infarction affecting left non-dominant side: Secondary | ICD-10-CM | POA: Diagnosis not present

## 2022-12-30 DIAGNOSIS — J449 Chronic obstructive pulmonary disease, unspecified: Secondary | ICD-10-CM | POA: Diagnosis not present

## 2022-12-31 DIAGNOSIS — E119 Type 2 diabetes mellitus without complications: Secondary | ICD-10-CM | POA: Diagnosis not present

## 2022-12-31 DIAGNOSIS — F419 Anxiety disorder, unspecified: Secondary | ICD-10-CM | POA: Diagnosis not present

## 2022-12-31 DIAGNOSIS — R634 Abnormal weight loss: Secondary | ICD-10-CM | POA: Diagnosis not present

## 2022-12-31 DIAGNOSIS — I69354 Hemiplegia and hemiparesis following cerebral infarction affecting left non-dominant side: Secondary | ICD-10-CM | POA: Diagnosis not present

## 2022-12-31 DIAGNOSIS — E46 Unspecified protein-calorie malnutrition: Secondary | ICD-10-CM | POA: Diagnosis not present

## 2022-12-31 DIAGNOSIS — E785 Hyperlipidemia, unspecified: Secondary | ICD-10-CM | POA: Diagnosis not present

## 2022-12-31 DIAGNOSIS — J449 Chronic obstructive pulmonary disease, unspecified: Secondary | ICD-10-CM | POA: Diagnosis not present

## 2022-12-31 DIAGNOSIS — I10 Essential (primary) hypertension: Secondary | ICD-10-CM | POA: Diagnosis not present

## 2022-12-31 DIAGNOSIS — B353 Tinea pedis: Secondary | ICD-10-CM | POA: Diagnosis not present

## 2023-01-06 DIAGNOSIS — M24542 Contracture, left hand: Secondary | ICD-10-CM | POA: Diagnosis not present

## 2023-01-06 DIAGNOSIS — I10 Essential (primary) hypertension: Secondary | ICD-10-CM | POA: Diagnosis not present

## 2023-01-06 DIAGNOSIS — R634 Abnormal weight loss: Secondary | ICD-10-CM | POA: Diagnosis not present

## 2023-01-06 DIAGNOSIS — F419 Anxiety disorder, unspecified: Secondary | ICD-10-CM | POA: Diagnosis not present

## 2023-01-06 DIAGNOSIS — E46 Unspecified protein-calorie malnutrition: Secondary | ICD-10-CM | POA: Diagnosis not present

## 2023-01-06 DIAGNOSIS — I69354 Hemiplegia and hemiparesis following cerebral infarction affecting left non-dominant side: Secondary | ICD-10-CM | POA: Diagnosis not present

## 2023-01-06 DIAGNOSIS — B353 Tinea pedis: Secondary | ICD-10-CM | POA: Diagnosis not present

## 2023-01-06 DIAGNOSIS — E119 Type 2 diabetes mellitus without complications: Secondary | ICD-10-CM | POA: Diagnosis not present

## 2023-01-06 DIAGNOSIS — E785 Hyperlipidemia, unspecified: Secondary | ICD-10-CM | POA: Diagnosis not present

## 2023-01-06 DIAGNOSIS — J449 Chronic obstructive pulmonary disease, unspecified: Secondary | ICD-10-CM | POA: Diagnosis not present

## 2023-01-07 DIAGNOSIS — E46 Unspecified protein-calorie malnutrition: Secondary | ICD-10-CM | POA: Diagnosis not present

## 2023-01-07 DIAGNOSIS — E785 Hyperlipidemia, unspecified: Secondary | ICD-10-CM | POA: Diagnosis not present

## 2023-01-07 DIAGNOSIS — F419 Anxiety disorder, unspecified: Secondary | ICD-10-CM | POA: Diagnosis not present

## 2023-01-07 DIAGNOSIS — B353 Tinea pedis: Secondary | ICD-10-CM | POA: Diagnosis not present

## 2023-01-07 DIAGNOSIS — J449 Chronic obstructive pulmonary disease, unspecified: Secondary | ICD-10-CM | POA: Diagnosis not present

## 2023-01-07 DIAGNOSIS — R634 Abnormal weight loss: Secondary | ICD-10-CM | POA: Diagnosis not present

## 2023-01-07 DIAGNOSIS — I10 Essential (primary) hypertension: Secondary | ICD-10-CM | POA: Diagnosis not present

## 2023-01-07 DIAGNOSIS — E119 Type 2 diabetes mellitus without complications: Secondary | ICD-10-CM | POA: Diagnosis not present

## 2023-01-07 DIAGNOSIS — I69354 Hemiplegia and hemiparesis following cerebral infarction affecting left non-dominant side: Secondary | ICD-10-CM | POA: Diagnosis not present

## 2023-01-13 DIAGNOSIS — B353 Tinea pedis: Secondary | ICD-10-CM | POA: Diagnosis not present

## 2023-01-13 DIAGNOSIS — E785 Hyperlipidemia, unspecified: Secondary | ICD-10-CM | POA: Diagnosis not present

## 2023-01-13 DIAGNOSIS — I69354 Hemiplegia and hemiparesis following cerebral infarction affecting left non-dominant side: Secondary | ICD-10-CM | POA: Diagnosis not present

## 2023-01-13 DIAGNOSIS — E119 Type 2 diabetes mellitus without complications: Secondary | ICD-10-CM | POA: Diagnosis not present

## 2023-01-13 DIAGNOSIS — F419 Anxiety disorder, unspecified: Secondary | ICD-10-CM | POA: Diagnosis not present

## 2023-01-13 DIAGNOSIS — I10 Essential (primary) hypertension: Secondary | ICD-10-CM | POA: Diagnosis not present

## 2023-01-13 DIAGNOSIS — R634 Abnormal weight loss: Secondary | ICD-10-CM | POA: Diagnosis not present

## 2023-01-13 DIAGNOSIS — E46 Unspecified protein-calorie malnutrition: Secondary | ICD-10-CM | POA: Diagnosis not present

## 2023-01-13 DIAGNOSIS — J449 Chronic obstructive pulmonary disease, unspecified: Secondary | ICD-10-CM | POA: Diagnosis not present

## 2023-01-15 DIAGNOSIS — I10 Essential (primary) hypertension: Secondary | ICD-10-CM | POA: Diagnosis not present

## 2023-01-15 DIAGNOSIS — B353 Tinea pedis: Secondary | ICD-10-CM | POA: Diagnosis not present

## 2023-01-15 DIAGNOSIS — R634 Abnormal weight loss: Secondary | ICD-10-CM | POA: Diagnosis not present

## 2023-01-15 DIAGNOSIS — E46 Unspecified protein-calorie malnutrition: Secondary | ICD-10-CM | POA: Diagnosis not present

## 2023-01-15 DIAGNOSIS — I69354 Hemiplegia and hemiparesis following cerebral infarction affecting left non-dominant side: Secondary | ICD-10-CM | POA: Diagnosis not present

## 2023-01-15 DIAGNOSIS — E119 Type 2 diabetes mellitus without complications: Secondary | ICD-10-CM | POA: Diagnosis not present

## 2023-01-15 DIAGNOSIS — J449 Chronic obstructive pulmonary disease, unspecified: Secondary | ICD-10-CM | POA: Diagnosis not present

## 2023-01-15 DIAGNOSIS — E785 Hyperlipidemia, unspecified: Secondary | ICD-10-CM | POA: Diagnosis not present

## 2023-01-15 DIAGNOSIS — F419 Anxiety disorder, unspecified: Secondary | ICD-10-CM | POA: Diagnosis not present

## 2023-01-24 ENCOUNTER — Telehealth: Payer: Self-pay

## 2023-01-24 NOTE — Telephone Encounter (Signed)
Melissa HH OT with [facility] calls nurse line requesting verbal orders for [facility] OT as follows.   1x a week for 3 weeks.   Verbal order left on confidential VM.

## 2023-01-27 DIAGNOSIS — E785 Hyperlipidemia, unspecified: Secondary | ICD-10-CM | POA: Diagnosis not present

## 2023-01-27 DIAGNOSIS — E46 Unspecified protein-calorie malnutrition: Secondary | ICD-10-CM | POA: Diagnosis not present

## 2023-01-27 DIAGNOSIS — J449 Chronic obstructive pulmonary disease, unspecified: Secondary | ICD-10-CM | POA: Diagnosis not present

## 2023-01-27 DIAGNOSIS — B353 Tinea pedis: Secondary | ICD-10-CM | POA: Diagnosis not present

## 2023-01-27 DIAGNOSIS — I69354 Hemiplegia and hemiparesis following cerebral infarction affecting left non-dominant side: Secondary | ICD-10-CM | POA: Diagnosis not present

## 2023-01-27 DIAGNOSIS — F419 Anxiety disorder, unspecified: Secondary | ICD-10-CM | POA: Diagnosis not present

## 2023-01-27 DIAGNOSIS — E119 Type 2 diabetes mellitus without complications: Secondary | ICD-10-CM | POA: Diagnosis not present

## 2023-01-27 DIAGNOSIS — R634 Abnormal weight loss: Secondary | ICD-10-CM | POA: Diagnosis not present

## 2023-01-27 DIAGNOSIS — I10 Essential (primary) hypertension: Secondary | ICD-10-CM | POA: Diagnosis not present

## 2023-01-29 DIAGNOSIS — I10 Essential (primary) hypertension: Secondary | ICD-10-CM | POA: Diagnosis not present

## 2023-01-29 DIAGNOSIS — E119 Type 2 diabetes mellitus without complications: Secondary | ICD-10-CM | POA: Diagnosis not present

## 2023-01-29 DIAGNOSIS — B353 Tinea pedis: Secondary | ICD-10-CM | POA: Diagnosis not present

## 2023-01-29 DIAGNOSIS — F419 Anxiety disorder, unspecified: Secondary | ICD-10-CM | POA: Diagnosis not present

## 2023-01-29 DIAGNOSIS — J449 Chronic obstructive pulmonary disease, unspecified: Secondary | ICD-10-CM | POA: Diagnosis not present

## 2023-01-29 DIAGNOSIS — E46 Unspecified protein-calorie malnutrition: Secondary | ICD-10-CM | POA: Diagnosis not present

## 2023-01-29 DIAGNOSIS — E785 Hyperlipidemia, unspecified: Secondary | ICD-10-CM | POA: Diagnosis not present

## 2023-01-29 DIAGNOSIS — R634 Abnormal weight loss: Secondary | ICD-10-CM | POA: Diagnosis not present

## 2023-01-29 DIAGNOSIS — I69354 Hemiplegia and hemiparesis following cerebral infarction affecting left non-dominant side: Secondary | ICD-10-CM | POA: Diagnosis not present

## 2023-01-29 IMAGING — CR DG HAND COMPLETE 3+V*L*
3 series · 3 of 3 positions shown · non-contrast
Comparison: None.

CLINICAL DATA: Pain after fall.

EXAM:
LEFT HAND - COMPLETE 3+ VIEW; LEFT WRIST - COMPLETE 3+ VIEW

[hand pa]
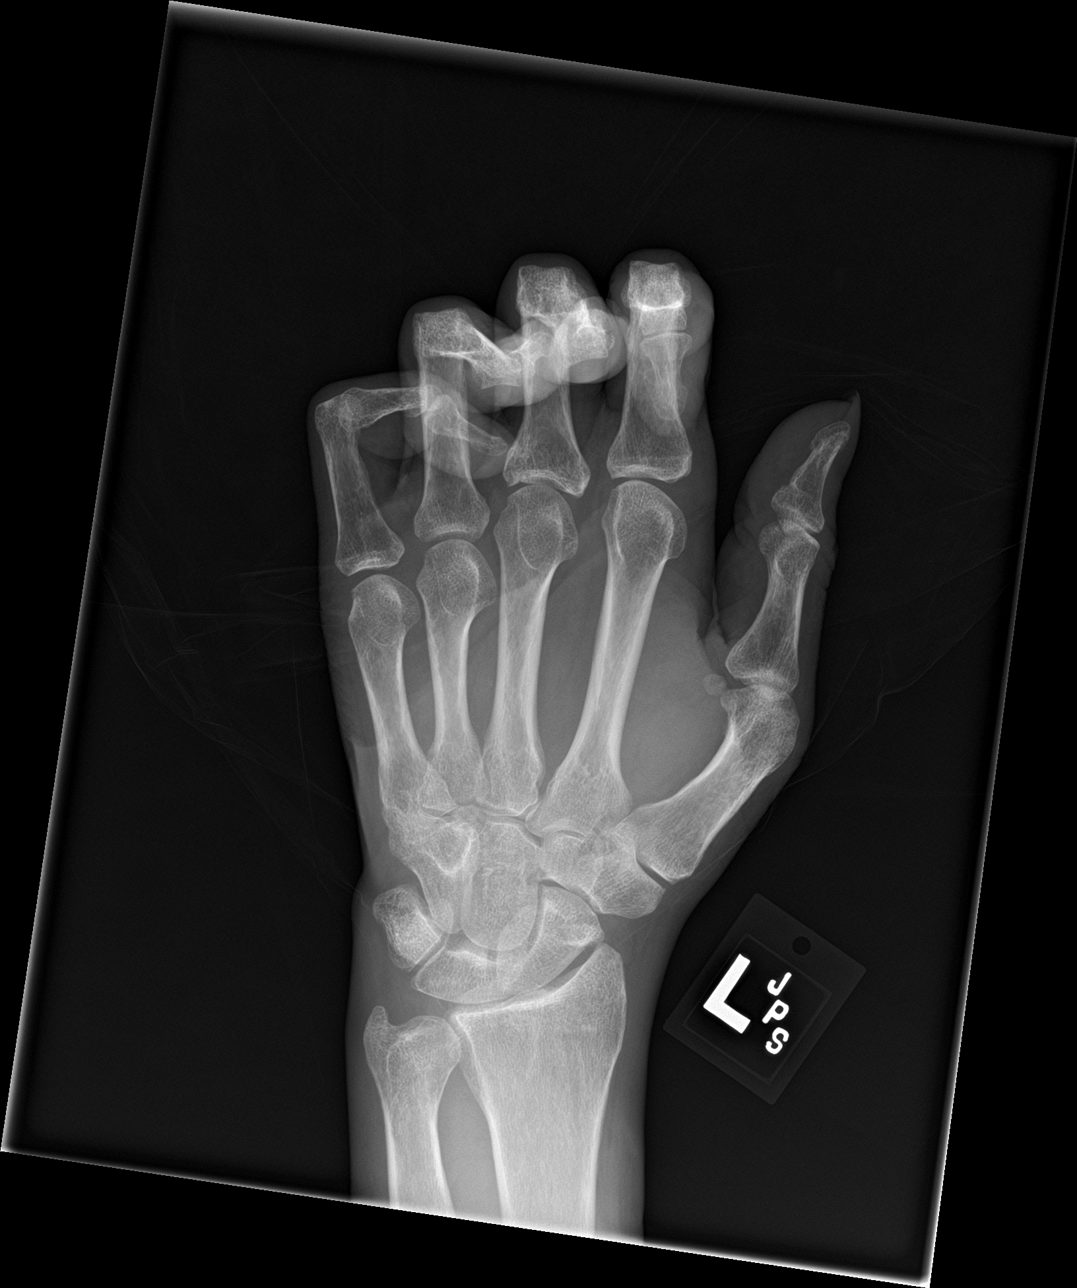

[hand obl]
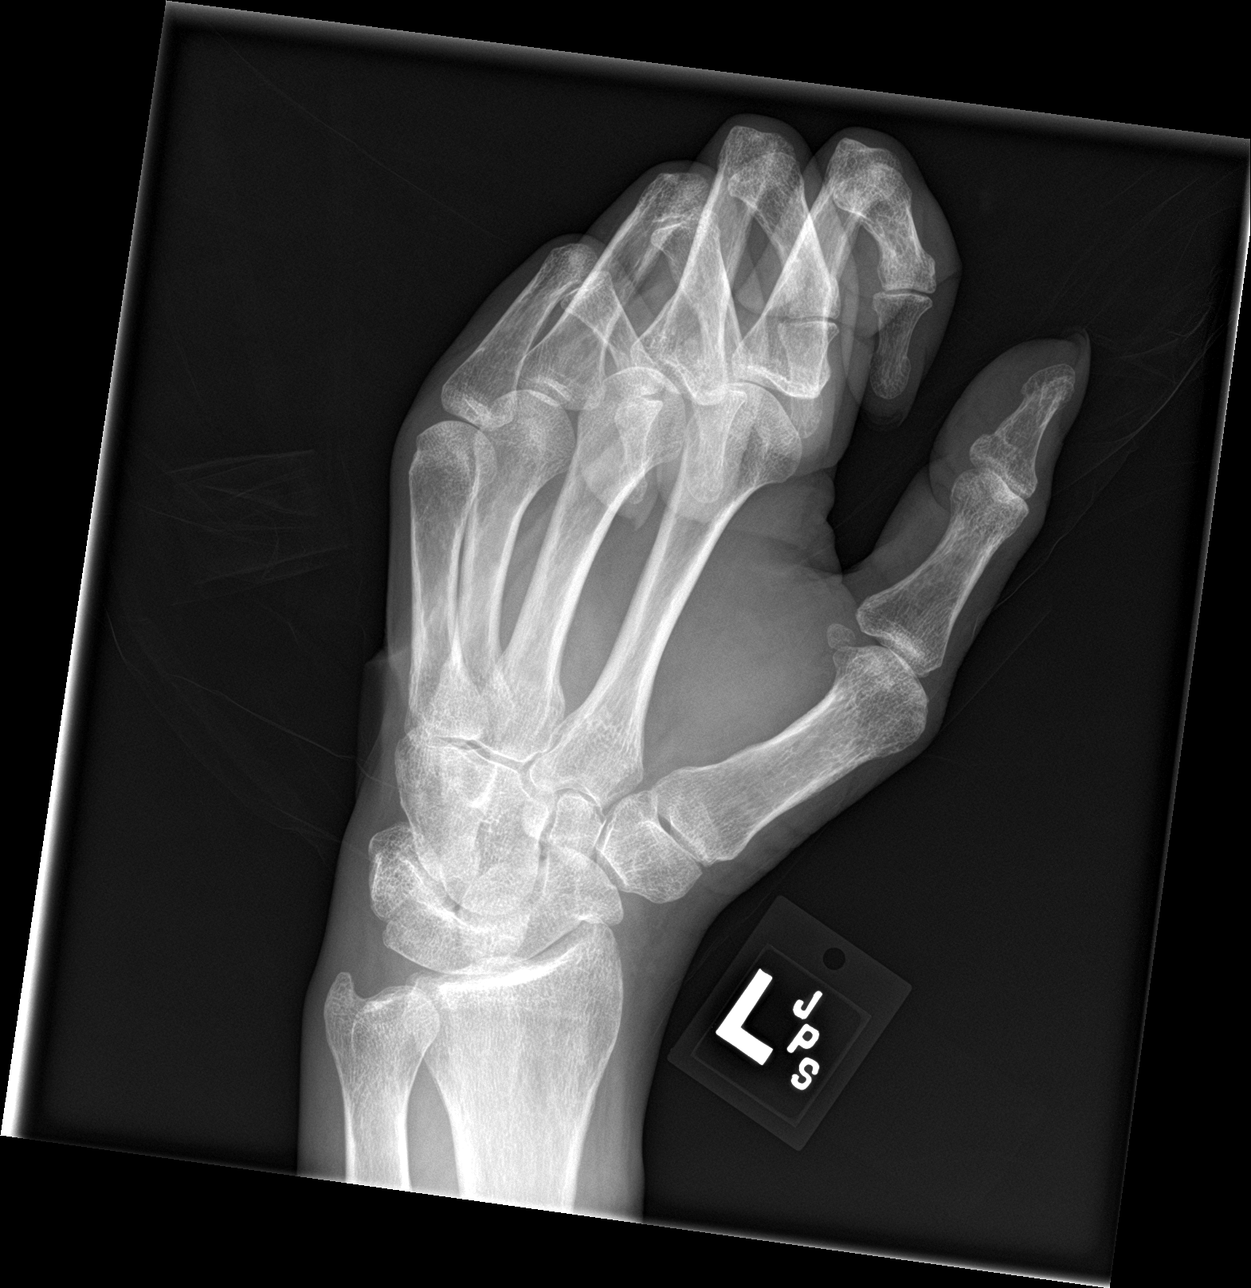

[hand lat]
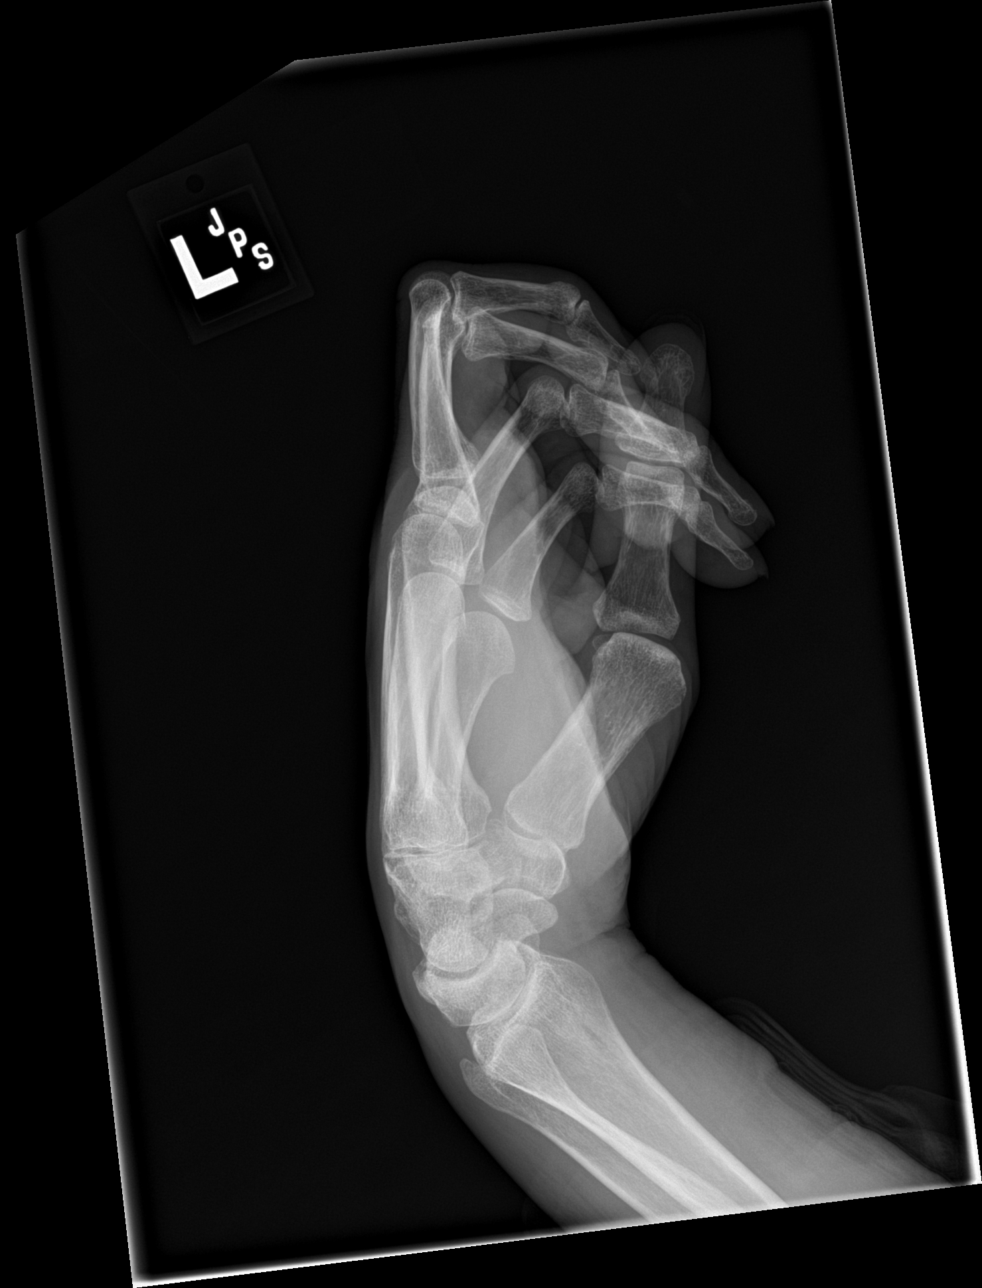

[3 of 3 positions shown; findings below may reference images not displayed]

FINDINGS: Left wrist: There is no evidence of fracture or dislocation. There
is no evidence of arthropathy or other focal bone abnormality. There
is soft tissue swelling surrounding the wrist.

Left hand: There is limited evaluation of the second through fifth
fingers secondary to flexion. There is no acute fracture or
dislocation identified. Soft tissues are within normal limits.
IMPRESSION: 1. No acute fracture or dislocation of the left wrist or left hand.
2. Left wrist soft tissue swelling.

## 2023-01-29 IMAGING — CR DG WRIST COMPLETE 3+V*L*
4 series · 4 of 4 positions shown · non-contrast
Comparison: None.

CLINICAL DATA: Pain after fall.

EXAM:
LEFT HAND - COMPLETE 3+ VIEW; LEFT WRIST - COMPLETE 3+ VIEW

[wrist pa]
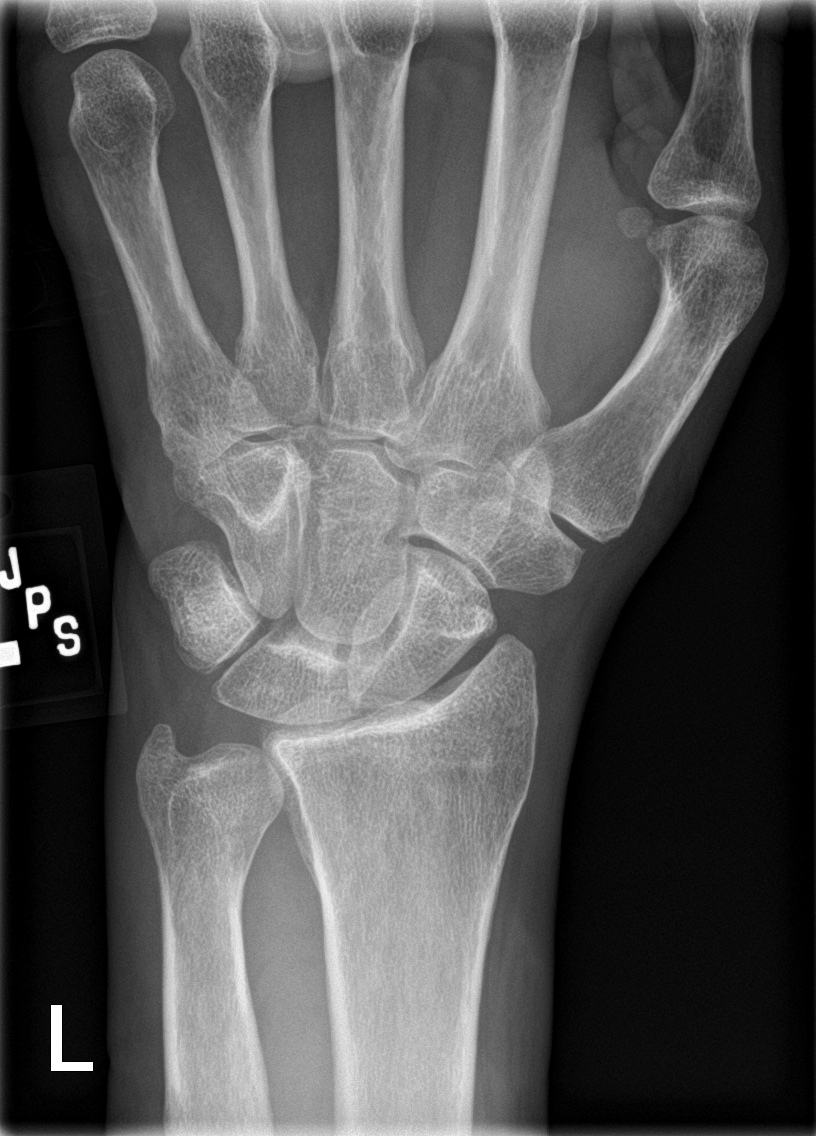

[wrist obl]
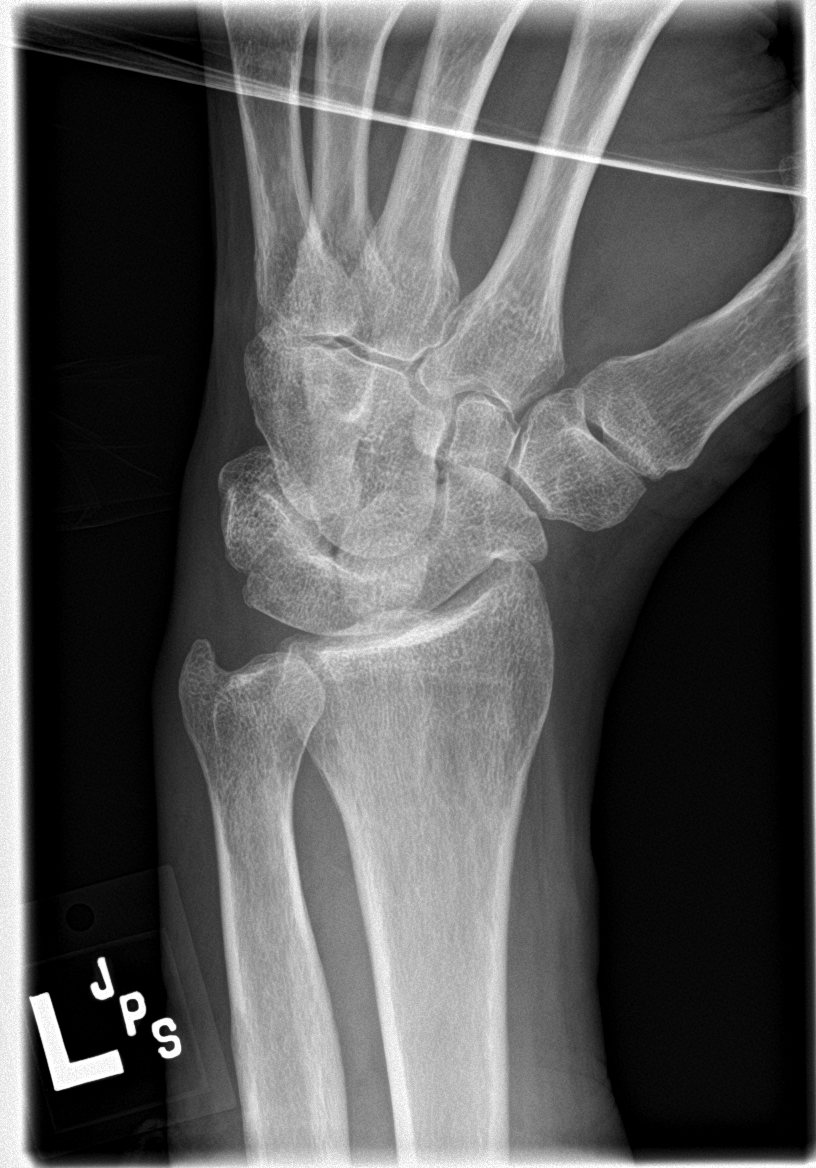

[wrist lat]
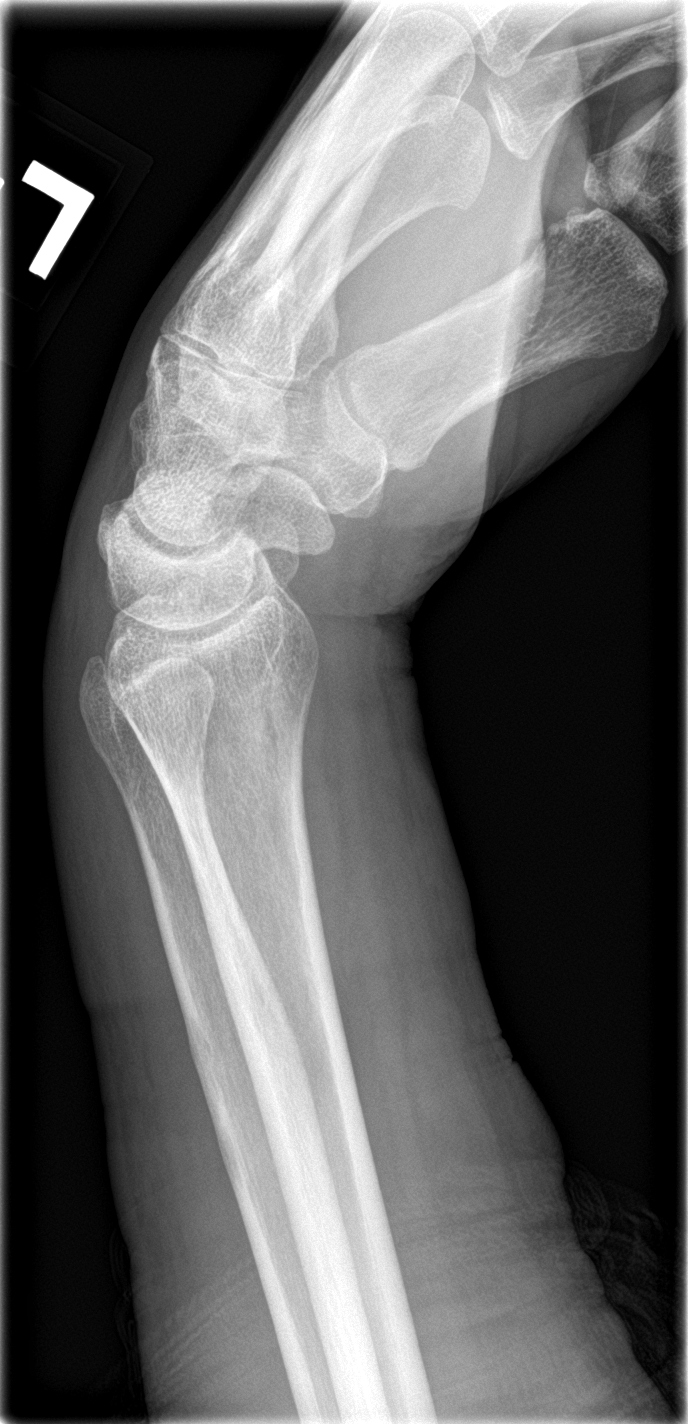

[wrist navicular]
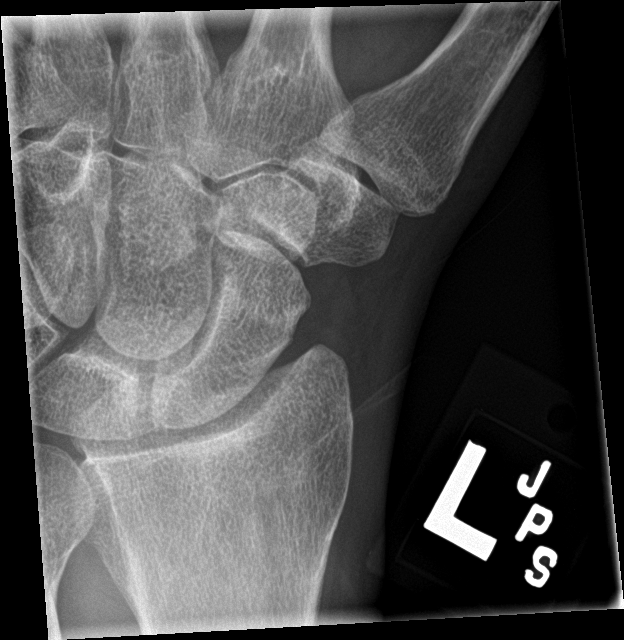

[4 of 4 positions shown; findings below may reference images not displayed]

FINDINGS: Left wrist: There is no evidence of fracture or dislocation. There
is no evidence of arthropathy or other focal bone abnormality. There
is soft tissue swelling surrounding the wrist.

Left hand: There is limited evaluation of the second through fifth
fingers secondary to flexion. There is no acute fracture or
dislocation identified. Soft tissues are within normal limits.
IMPRESSION: 1. No acute fracture or dislocation of the left wrist or left hand.
2. Left wrist soft tissue swelling.

## 2023-02-12 DIAGNOSIS — F419 Anxiety disorder, unspecified: Secondary | ICD-10-CM | POA: Diagnosis not present

## 2023-02-12 DIAGNOSIS — R634 Abnormal weight loss: Secondary | ICD-10-CM | POA: Diagnosis not present

## 2023-02-12 DIAGNOSIS — B353 Tinea pedis: Secondary | ICD-10-CM | POA: Diagnosis not present

## 2023-02-12 DIAGNOSIS — J449 Chronic obstructive pulmonary disease, unspecified: Secondary | ICD-10-CM | POA: Diagnosis not present

## 2023-02-12 DIAGNOSIS — E46 Unspecified protein-calorie malnutrition: Secondary | ICD-10-CM | POA: Diagnosis not present

## 2023-02-12 DIAGNOSIS — E785 Hyperlipidemia, unspecified: Secondary | ICD-10-CM | POA: Diagnosis not present

## 2023-02-12 DIAGNOSIS — I69354 Hemiplegia and hemiparesis following cerebral infarction affecting left non-dominant side: Secondary | ICD-10-CM | POA: Diagnosis not present

## 2023-02-12 DIAGNOSIS — I10 Essential (primary) hypertension: Secondary | ICD-10-CM | POA: Diagnosis not present

## 2023-02-12 DIAGNOSIS — E119 Type 2 diabetes mellitus without complications: Secondary | ICD-10-CM | POA: Diagnosis not present

## 2023-02-17 DIAGNOSIS — F419 Anxiety disorder, unspecified: Secondary | ICD-10-CM | POA: Diagnosis not present

## 2023-02-17 DIAGNOSIS — J449 Chronic obstructive pulmonary disease, unspecified: Secondary | ICD-10-CM | POA: Diagnosis not present

## 2023-02-17 DIAGNOSIS — I69354 Hemiplegia and hemiparesis following cerebral infarction affecting left non-dominant side: Secondary | ICD-10-CM | POA: Diagnosis not present

## 2023-02-17 DIAGNOSIS — E785 Hyperlipidemia, unspecified: Secondary | ICD-10-CM | POA: Diagnosis not present

## 2023-02-17 DIAGNOSIS — R634 Abnormal weight loss: Secondary | ICD-10-CM | POA: Diagnosis not present

## 2023-02-17 DIAGNOSIS — B353 Tinea pedis: Secondary | ICD-10-CM | POA: Diagnosis not present

## 2023-02-17 DIAGNOSIS — I10 Essential (primary) hypertension: Secondary | ICD-10-CM | POA: Diagnosis not present

## 2023-02-17 DIAGNOSIS — E119 Type 2 diabetes mellitus without complications: Secondary | ICD-10-CM | POA: Diagnosis not present

## 2023-02-17 DIAGNOSIS — E46 Unspecified protein-calorie malnutrition: Secondary | ICD-10-CM | POA: Diagnosis not present

## 2023-02-18 DIAGNOSIS — J449 Chronic obstructive pulmonary disease, unspecified: Secondary | ICD-10-CM | POA: Diagnosis not present

## 2023-02-18 DIAGNOSIS — E785 Hyperlipidemia, unspecified: Secondary | ICD-10-CM | POA: Diagnosis not present

## 2023-02-18 DIAGNOSIS — I10 Essential (primary) hypertension: Secondary | ICD-10-CM | POA: Diagnosis not present

## 2023-02-18 DIAGNOSIS — E119 Type 2 diabetes mellitus without complications: Secondary | ICD-10-CM | POA: Diagnosis not present

## 2023-02-18 DIAGNOSIS — E46 Unspecified protein-calorie malnutrition: Secondary | ICD-10-CM | POA: Diagnosis not present

## 2023-02-18 DIAGNOSIS — B353 Tinea pedis: Secondary | ICD-10-CM | POA: Diagnosis not present

## 2023-02-18 DIAGNOSIS — I69354 Hemiplegia and hemiparesis following cerebral infarction affecting left non-dominant side: Secondary | ICD-10-CM | POA: Diagnosis not present

## 2023-02-18 DIAGNOSIS — R634 Abnormal weight loss: Secondary | ICD-10-CM | POA: Diagnosis not present

## 2023-02-18 DIAGNOSIS — F419 Anxiety disorder, unspecified: Secondary | ICD-10-CM | POA: Diagnosis not present

## 2023-02-19 DIAGNOSIS — B353 Tinea pedis: Secondary | ICD-10-CM | POA: Diagnosis not present

## 2023-02-19 DIAGNOSIS — J449 Chronic obstructive pulmonary disease, unspecified: Secondary | ICD-10-CM | POA: Diagnosis not present

## 2023-02-19 DIAGNOSIS — E46 Unspecified protein-calorie malnutrition: Secondary | ICD-10-CM | POA: Diagnosis not present

## 2023-02-19 DIAGNOSIS — I10 Essential (primary) hypertension: Secondary | ICD-10-CM | POA: Diagnosis not present

## 2023-02-19 DIAGNOSIS — R634 Abnormal weight loss: Secondary | ICD-10-CM | POA: Diagnosis not present

## 2023-02-19 DIAGNOSIS — F419 Anxiety disorder, unspecified: Secondary | ICD-10-CM | POA: Diagnosis not present

## 2023-02-19 DIAGNOSIS — I69354 Hemiplegia and hemiparesis following cerebral infarction affecting left non-dominant side: Secondary | ICD-10-CM | POA: Diagnosis not present

## 2023-02-19 DIAGNOSIS — E785 Hyperlipidemia, unspecified: Secondary | ICD-10-CM | POA: Diagnosis not present

## 2023-02-19 DIAGNOSIS — E119 Type 2 diabetes mellitus without complications: Secondary | ICD-10-CM | POA: Diagnosis not present

## 2023-02-26 DIAGNOSIS — F419 Anxiety disorder, unspecified: Secondary | ICD-10-CM | POA: Diagnosis not present

## 2023-02-26 DIAGNOSIS — E46 Unspecified protein-calorie malnutrition: Secondary | ICD-10-CM | POA: Diagnosis not present

## 2023-02-26 DIAGNOSIS — E119 Type 2 diabetes mellitus without complications: Secondary | ICD-10-CM | POA: Diagnosis not present

## 2023-02-26 DIAGNOSIS — J449 Chronic obstructive pulmonary disease, unspecified: Secondary | ICD-10-CM | POA: Diagnosis not present

## 2023-02-26 DIAGNOSIS — I10 Essential (primary) hypertension: Secondary | ICD-10-CM | POA: Diagnosis not present

## 2023-02-26 DIAGNOSIS — I2510 Atherosclerotic heart disease of native coronary artery without angina pectoris: Secondary | ICD-10-CM | POA: Diagnosis not present

## 2023-02-26 DIAGNOSIS — I69354 Hemiplegia and hemiparesis following cerebral infarction affecting left non-dominant side: Secondary | ICD-10-CM | POA: Diagnosis not present

## 2023-02-26 DIAGNOSIS — E785 Hyperlipidemia, unspecified: Secondary | ICD-10-CM | POA: Diagnosis not present

## 2023-02-26 DIAGNOSIS — B353 Tinea pedis: Secondary | ICD-10-CM | POA: Diagnosis not present

## 2023-03-17 DIAGNOSIS — I69354 Hemiplegia and hemiparesis following cerebral infarction affecting left non-dominant side: Secondary | ICD-10-CM | POA: Diagnosis not present

## 2023-03-17 DIAGNOSIS — E119 Type 2 diabetes mellitus without complications: Secondary | ICD-10-CM | POA: Diagnosis not present

## 2023-03-17 DIAGNOSIS — I10 Essential (primary) hypertension: Secondary | ICD-10-CM | POA: Diagnosis not present

## 2023-03-17 DIAGNOSIS — J449 Chronic obstructive pulmonary disease, unspecified: Secondary | ICD-10-CM | POA: Diagnosis not present

## 2023-03-17 DIAGNOSIS — F419 Anxiety disorder, unspecified: Secondary | ICD-10-CM | POA: Diagnosis not present

## 2023-03-17 DIAGNOSIS — E46 Unspecified protein-calorie malnutrition: Secondary | ICD-10-CM | POA: Diagnosis not present

## 2023-03-17 DIAGNOSIS — B353 Tinea pedis: Secondary | ICD-10-CM | POA: Diagnosis not present

## 2023-03-17 DIAGNOSIS — I2510 Atherosclerotic heart disease of native coronary artery without angina pectoris: Secondary | ICD-10-CM | POA: Diagnosis not present

## 2023-03-17 DIAGNOSIS — E785 Hyperlipidemia, unspecified: Secondary | ICD-10-CM | POA: Diagnosis not present

## 2023-03-24 ENCOUNTER — Ambulatory Visit (INDEPENDENT_AMBULATORY_CARE_PROVIDER_SITE_OTHER): Payer: Medicare HMO

## 2023-03-24 VITALS — Ht 68.75 in | Wt 165.0 lb

## 2023-03-24 DIAGNOSIS — Z0000 Encounter for general adult medical examination without abnormal findings: Secondary | ICD-10-CM

## 2023-03-24 NOTE — Patient Instructions (Signed)
 Vincent Byrd , Thank you for taking time to come for your Medicare Wellness Visit. I appreciate your ongoing commitment to your health goals. Please review the following plan we discussed and let me know if I can assist you in the future.   Referrals/Orders/Follow-Ups/Clinician Recommendations: Yes; Keep maintaining your health by keeping your appointments with Dr. Ardyth Harps and any specialists that you may see.  Call us if you need anything.  Have a great year!!!!  This is a list of the screening recommended for you and due dates:  Health Maintenance  Topic Date Due   Pneumococcal Vaccination (1 of 2 - PCV) Never done   Zoster (Shingles) Vaccine (1 of 2) Never done   COVID-19 Vaccine (3 - 2024-25 season) 09/29/2022   Screening for Lung Cancer  08/05/2023   Medicare Annual Wellness Visit  03/23/2024   DTaP/Tdap/Td vaccine (2 - Td or Tdap) 11/03/2024   Colon Cancer Screening  09/18/2031   Flu Shot  Completed   Hepatitis C Screening  Completed   HIV Screening  Completed   HPV Vaccine  Aged Out    Advanced directives: (Declined) Advance directive discussed with you today. Even though you declined this today, please call our office should you change your mind, and we can give you the proper paperwork for you to fill out.  Next Medicare Annual Wellness Visit scheduled for next year: Yes

## 2023-03-24 NOTE — Progress Notes (Cosign Needed Addendum)
 Subjective:   Vincent Byrd is a 63 y.o. who presents for a Medicare Wellness preventive visit.  Visit Complete: Virtual I connected with  Vincent Byrd on 03/24/23 by a audio enabled telemedicine application and verified that I am speaking with the correct person using two identifiers.  Patient Location: Home  Provider Location: Office/Clinic  I discussed the limitations of evaluation and management by telemedicine. The patient expressed understanding and agreed to proceed.  Vital Signs: Because this visit was a virtual/telehealth visit, some criteria may be missing or patient reported. Any vitals not documented were not able to be obtained and vitals that have been documented are patient reported.  VideoDeclined- This patient declined Librarian, academic. Therefore the visit was completed with audio only.  AWV Questionnaire: No: Patient Medicare AWV questionnaire was not completed prior to this visit.  Cardiac Risk Factors include: advanced age (>36men, >56 women);male gender;sedentary lifestyle;smoking/ tobacco exposure;hypertension;dyslipidemia     Objective:    Today's Vitals   03/24/23 1346  Weight: 165 lb (74.8 kg)  Height: 5' 8.75" (1.746 m)  PainSc: 0-No pain   Body mass index is 24.54 kg/m.     03/24/2023    1:49 PM 05/23/2022    9:57 AM 03/19/2022   10:16 AM 01/10/2022   10:04 AM 08/14/2021   12:06 PM 06/06/2021   10:58 AM 03/09/2021    9:04 AM  Advanced Directives  Does Patient Have a Medical Advance Directive? No No No No No No No  Would patient like information on creating a medical advance directive? No - Patient declined  No - Patient declined No - Patient declined No - Patient declined No - Patient declined No - Patient declined    Current Medications (verified) Outpatient Encounter Medications as of 03/24/2023  Medication Sig   amLODipine (NORVASC) 5 MG tablet Take 1 tablet (5 mg total) by mouth at bedtime.   aspirin EC  81 MG tablet Take 81 mg by mouth daily. Swallow whole.   atorvastatin (LIPITOR) 40 MG tablet TAKE 1 TABLET EVERY DAY   fluticasone (FLONASE) 50 MCG/ACT nasal spray Place 2 sprays into both nostrils daily.   Baclofen 5 MG TABS Take 1 tablet by mouth 3 (three) times daily as needed. (Patient not taking: Reported on 05/23/2022)   Capsaicin 0.05 % CREA Apply 1 application topically every 4 (four) hours as needed (Hand pain). (Patient not taking: Reported on 03/19/2022)   guaiFENesin (MUCINEX) 600 MG 12 hr tablet Take 1 tablet (600 mg total) by mouth 2 (two) times daily.   ketoconazole (NIZORAL) 2 % cream Apply topically daily.   mirtazapine (REMERON) 7.5 MG tablet Take 1 tablet (7.5 mg total) by mouth at bedtime.   nicotine (NICODERM CQ - DOSED IN MG/24 HOURS) 21 mg/24hr patch Place 1 patch (21 mg total) onto the skin daily.   polyethylene glycol powder (GLYCOLAX/MIRALAX) 17 GM/SCOOP powder Take 17 g by mouth daily.   Vitamin D, Ergocalciferol, (DRISDOL) 1.25 MG (50000 UT) CAPS capsule TAKE 1 CAPSULE EVERY 7 DAYS (Patient not taking: Reported on 03/19/2022)   No facility-administered encounter medications on file as of 03/24/2023.    Allergies (verified) Amoxicillin and Ampicillin   History: Past Medical History:  Diagnosis Date   Hemiparesis (HCC)    affecting left side, secondary to stroke in 1997   Stroke [facility])    Past Surgical History:  Procedure Laterality Date   APPENDECTOMY     age 30   TONSILLECTOMY  Family History  Problem Relation Age of Onset   Diabetes Mother    Breast cancer Mother    Colon cancer Brother 31   Social History   Socioeconomic History   Marital status: Divorced    Spouse name: Not on file   Number of children: 2   Years of education: some college   Highest education level: Some college, no degree  Occupational History   Occupation: disabled    Comment: tobacco Set designer  Tobacco Use   Smoking status: Every Day    Current packs/day: 1.00     Average packs/day: 1 pack/day for 35.0 years (35.0 ttl pk-yrs)    Types: Cigarettes   Smokeless tobacco: Never   Tobacco comments:    wants to discuss further with PCP  Vaping Use   Vaping status: Never Used  Substance and Sexual Activity   Alcohol use: Not Currently    Alcohol/week: 6.0 standard drinks of alcohol    Types: 4 Cans of beer, 2 Shots of liquor per week    Comment: 6 on weekend   Drug use: No   Sexual activity: Not Currently  Other Topics Concern   Not on file  Social History Narrative   Lives with son. Lives in house, one level, has step in shower, has a ramp for wheelchair. Has grab bars in restroom. Smoke alarms.      Has medical alert button and a home health aide 4 days a week for 2 hours. Would like to have HH aide 7 days a week.      Has a modified Vincent Byrd he drives and uses [employer].      Has a chihuahua named JoJo.       Likes to sit on porch, likes to listen to radio.      Likes to eat mostly meats, wants to add in vegetables, eats some fruits, has a sweet tooth. Likes ginger ale and beer.      Wears seat belt in vehicle.    Social Drivers of Corporate investment banker Strain: Low Risk  (03/24/2023)   Overall Financial Resource Strain (CARDIA)    Difficulty of Paying Living Expenses: Not hard at all  Food Insecurity: No Food Insecurity (03/24/2023)   Hunger Vital Sign    Worried About Running Out of Food in the Last Year: Never true    Ran Out of Food in the Last Year: Never true  Transportation Needs: No Transportation Needs (03/24/2023)   PRAPARE - Administrator, Civil Service (Medical): No    Lack of Transportation (Non-Medical): No  Physical Activity: Inactive (03/24/2023)   Exercise Vital Sign    Days of Exercise per Week: 0 days    Minutes of Exercise per Session: 0 min  Stress: No Stress Concern Present (03/24/2023)   [employer] of Occupational Health - Occupational Stress Questionnaire    Feeling of Stress : Not at all  Social  Connections: Moderately Isolated (03/24/2023)   Social Connection and Isolation Panel [NHANES]    Frequency of Communication with Friends and Family: More than three times a week    Frequency of Social Gatherings with Friends and Family: More than three times a week    Attends Religious Services: Never    Database administrator or Organizations: No    Attends Engineer, structural: More than 4 times per year    Marital Status: Divorced    Tobacco Counseling Ready to quit: Not Answered Counseling given: Not  Answered Tobacco comments: wants to discuss further with PCP    Clinical Intake:  Pre-visit preparation completed: Yes  Pain : No/denies pain Pain Score: 0-No pain     BMI - recorded: 24.54 Nutritional Status: BMI of 19-24  Normal Nutritional Risks: None Diabetes: No  How often do you need to have someone help you when you read instructions, pamphlets, or other written materials from your doctor or pharmacy?: 1 - Never What is the last grade level you completed in school?: HSG; 2 YEARS OF COLLEGE  Interpreter Needed?: No  Information entered by :: Salem Mastrogiovanni N. Amilio Zehnder, LPN.   Activities of Daily Living     03/24/2023    1:53 PM  In your present state of health, do you have any difficulty performing the following activities:  Hearing? 0  Vision? 0  Difficulty concentrating or making decisions? 1  Comment not since the stroke  Walking or climbing stairs? 1  Dressing or bathing? 1  Doing errands, shopping? 1  Preparing Food and eating ? N  Using the Toilet? N  In the past six months, have you accidently leaked urine? N  Do you have problems with loss of bowel control? N  Managing your Medications? N  Managing your Finances? N  Housekeeping or managing your Housekeeping? Y    Patient Care Team: Elberta Fortis, MD as PCP - General (Family Medicine) Gastroenterology, Ermalinda Barrios any recent Medical Services you may have received from other than  [facility] providers in the past year (date may be approximate).     Assessment:   This is a routine wellness examination for Meyer.  Hearing/Vision screen Hearing Screening - Comments:: Denies hearing difficulties. No hearing aids.  Vision Screening - Comments:: Wears reading glasses - up to date with routine eye exams with Leafy Ro, MD.    Goals Addressed             This Visit's Progress    Client understands the importance of follow-up with providers by attending scheduled visits.       My goal for 2025 is to quit smoking and drinking beer.         Depression Screen     03/24/2023    1:56 PM 05/23/2022    9:57 AM 03/19/2022    9:50 AM 08/14/2021   12:07 PM 06/06/2021   10:58 AM 03/09/2021    9:01 AM 12/15/2020    8:52 AM  PHQ 2/9 Scores  PHQ - 2 Score 0 3 0 0 0 3 3  PHQ- 9 Score 0 3  0 0 5 3    Fall Risk     03/24/2023    1:51 PM 03/19/2022    9:50 AM 01/10/2022    9:59 AM 03/09/2021    9:04 AM 02/12/2021    9:25 AM  Fall Risk   Falls in the past year? 1 0 0 0 0  Number falls in past yr: 0 0 0 0   Injury with Fall? 1 0 0 0   Risk for fall due to : History of fall(s);Impaired balance/gait;Orthopedic patient No Fall Risks     Follow up Falls prevention discussed;Falls evaluation completed Falls evaluation completed       MEDICARE RISK AT HOME:  Medicare Risk at Home Any stairs in or around the home?: No If so, are there any without handrails?: No Home free of loose throw rugs in walkways, pet beds, electrical cords, etc?: Yes Adequate lighting in your home to reduce  risk of falls?: Yes Life alert?: Yes (Medical alert) Use of a cane, walker or w/c?: Yes Grab bars in the bathroom?: Yes Shower chair or bench in shower?: Yes Elevated toilet seat or a handicapped toilet?: Yes  TIMED UP AND GO:  Was the test performed?  No  Cognitive Function: 6CIT completed    03/24/2023    1:53 PM 11/21/2017   10:31 AM  MMSE - Mini Mental State Exam  Not completed:  Unable to complete   Orientation to time  5  Orientation to Place  5  Registration  3  Attention/ Calculation  5  Recall  3  Language- name 2 objects  2  Language- repeat  1  Language- follow 3 step command  3  Language- read & follow direction  1  Write a sentence  1  Copy design  1  Total score  30        03/24/2023    1:52 PM 03/19/2022    9:52 AM 10/14/2020   10:28 AM 11/21/2017   10:32 AM  6CIT Screen  What Year? 0 points 0 points 0 points 0 points  What month? 0 points 0 points 0 points 0 points  What time? 0 points 0 points 0 points 0 points  Count back from 20 0 points 2 points 0 points 0 points  Months in reverse 0 points 0 points 0 points 0 points  Repeat phrase 8 points 0 points 0 points 0 points  Total Score 8 points 2 points 0 points 0 points    Immunizations Immunization History  Administered Date(s) Administered   Influenza, Seasonal, Injecte, Preservative Fre 10/22/2022   Influenza,inj,Quad PF,6+ Mos 11/28/2020, 01/10/2022   Moderna Sars-Covid-2 Vaccination 05/25/2019, 09/27/2019   Tdap 11/04/2014    Screening Tests Health Maintenance  Topic Date Due   Pneumococcal Vaccine 45-8 Years old (1 of 2 - PCV) Never done   Zoster Vaccines- Shingrix (1 of 2) Never done   COVID-19 Vaccine (3 - 2024-25 season) 09/29/2022   Lung Cancer Screening  08/05/2023   Medicare Annual Wellness (AWV)  03/23/2024   DTaP/Tdap/Td (2 - Td or Tdap) 11/03/2024   Colonoscopy  09/18/2031   INFLUENZA VACCINE  Completed   Hepatitis C Screening  Completed   HIV Screening  Completed   HPV VACCINES  Aged Out    Health Maintenance  Health Maintenance Due  Topic Date Due   Pneumococcal Vaccine 76-103 Years old (1 of 2 - PCV) Never done   Zoster Vaccines- Shingrix (1 of 2) Never done   COVID-19 Vaccine (3 - 2024-25 season) 09/29/2022   Health Maintenance Items Addressed: yes Patient declined Covid, Shingrix and Pneumonia vaccines.  Additional Screening:  Vision Screening:  Recommended annual ophthalmology exams for early detection of glaucoma and other disorders of the eye.  Dental Screening: Recommended annual dental exams for proper oral hygiene  Community Resource Referral / Chronic Care Management: CRR required this visit?  No   CCM required this visit?  No     Plan:     I have personally reviewed and noted the following in the patient's chart:   Medical and social history Use of alcohol, tobacco or illicit drugs  Current medications and supplements including opioid prescriptions. Patient is not currently taking opioid prescriptions. Functional ability and status Nutritional status Physical activity Advanced directives List of other physicians Hospitalizations, surgeries, and ER visits in previous 12 months Vitals Screenings to include cognitive, depression, and falls Referrals and appointments  In  addition, I have reviewed and discussed with patient certain preventive protocols, quality metrics, and best practice recommendations. A written personalized care plan for preventive services as well as general preventive health recommendations were provided to patient.     Mickeal Needy, LPN   9/60/4540   After Visit Summary: (Declined) Due to this being a telephonic visit, with patients personalized plan was offered to patient but patient Declined AVS at this time   Notes: Please refer to Routing Comments.

## 2023-03-25 DIAGNOSIS — I10 Essential (primary) hypertension: Secondary | ICD-10-CM | POA: Diagnosis not present

## 2023-03-25 DIAGNOSIS — E119 Type 2 diabetes mellitus without complications: Secondary | ICD-10-CM | POA: Diagnosis not present

## 2023-03-25 DIAGNOSIS — I69354 Hemiplegia and hemiparesis following cerebral infarction affecting left non-dominant side: Secondary | ICD-10-CM | POA: Diagnosis not present

## 2023-03-25 DIAGNOSIS — F419 Anxiety disorder, unspecified: Secondary | ICD-10-CM | POA: Diagnosis not present

## 2023-03-25 DIAGNOSIS — J449 Chronic obstructive pulmonary disease, unspecified: Secondary | ICD-10-CM | POA: Diagnosis not present

## 2023-03-25 DIAGNOSIS — I2510 Atherosclerotic heart disease of native coronary artery without angina pectoris: Secondary | ICD-10-CM | POA: Diagnosis not present

## 2023-03-25 DIAGNOSIS — E785 Hyperlipidemia, unspecified: Secondary | ICD-10-CM | POA: Diagnosis not present

## 2023-03-25 DIAGNOSIS — E46 Unspecified protein-calorie malnutrition: Secondary | ICD-10-CM | POA: Diagnosis not present

## 2023-03-25 DIAGNOSIS — B353 Tinea pedis: Secondary | ICD-10-CM | POA: Diagnosis not present

## 2023-03-27 ENCOUNTER — Encounter: Payer: Self-pay | Admitting: Student

## 2023-03-27 ENCOUNTER — Ambulatory Visit: Payer: Medicare HMO | Admitting: Student

## 2023-03-27 VITALS — BP 125/67 | HR 70

## 2023-03-27 DIAGNOSIS — Z720 Tobacco use: Secondary | ICD-10-CM | POA: Diagnosis not present

## 2023-03-27 DIAGNOSIS — L299 Pruritus, unspecified: Secondary | ICD-10-CM | POA: Diagnosis not present

## 2023-03-27 DIAGNOSIS — F1090 Alcohol use, unspecified, uncomplicated: Secondary | ICD-10-CM

## 2023-03-27 DIAGNOSIS — J449 Chronic obstructive pulmonary disease, unspecified: Secondary | ICD-10-CM | POA: Diagnosis not present

## 2023-03-27 DIAGNOSIS — Z789 Other specified health status: Secondary | ICD-10-CM | POA: Diagnosis not present

## 2023-03-27 DIAGNOSIS — E46 Unspecified protein-calorie malnutrition: Secondary | ICD-10-CM | POA: Diagnosis not present

## 2023-03-27 DIAGNOSIS — R591 Generalized enlarged lymph nodes: Secondary | ICD-10-CM | POA: Diagnosis not present

## 2023-03-27 DIAGNOSIS — B353 Tinea pedis: Secondary | ICD-10-CM | POA: Diagnosis not present

## 2023-03-27 DIAGNOSIS — I2510 Atherosclerotic heart disease of native coronary artery without angina pectoris: Secondary | ICD-10-CM | POA: Diagnosis not present

## 2023-03-27 DIAGNOSIS — F419 Anxiety disorder, unspecified: Secondary | ICD-10-CM | POA: Diagnosis not present

## 2023-03-27 DIAGNOSIS — E119 Type 2 diabetes mellitus without complications: Secondary | ICD-10-CM | POA: Diagnosis not present

## 2023-03-27 DIAGNOSIS — I10 Essential (primary) hypertension: Secondary | ICD-10-CM | POA: Diagnosis not present

## 2023-03-27 DIAGNOSIS — I69354 Hemiplegia and hemiparesis following cerebral infarction affecting left non-dominant side: Secondary | ICD-10-CM | POA: Diagnosis not present

## 2023-03-27 DIAGNOSIS — E785 Hyperlipidemia, unspecified: Secondary | ICD-10-CM | POA: Diagnosis not present

## 2023-03-27 NOTE — Progress Notes (Signed)
    SUBJECTIVE:   CHIEF COMPLAINT / HPI:   Lymphadenopathy Here for a painless "lump" on the left side of his neck. He is unsure exactly how long it has been there but tells me that it is "more than a couple of days."  He noticed to because he was having some itching of the skin above it.  He thought that he had a rash, though no overlying skin changes are noted today. He wears "buffs" around his neck near daily and thought these might be contributing. As he was scratching the area he could feel the "lump" underneath.  He tells me that this concerned him because he recently had a friend die of head neck cancer.  He understands that he is at increased risk for certain malignancies based on his extensive smoking history.  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  Does me that he does have a cough, though notes that this is chronic and he attributes it to his smoking.  No significant or persistent throat pain, though he does acknowledge some pains that come and go. No issues with swallowing.  Alcohol Use Tells me that he would like his liver checked today.  He has a history of fairly heavy alcohol use (1-2 40oz malt liquors daily for many years), but now only drinks a similar amount on the weekends. Not drinking at all during the week.   PERTINENT  PMH / PSH: Hemiparesis 2/2 prior stroke; longstanding use of both tobacco and alcohol.  OBJECTIVE:   BP 125/67   Pulse 70   SpO2 98%   Gen: Using wheelchair, alert, engaged, in good spirits HENT: Oropharynx without obvious lesion, uvula is midline. Palate rise is asymmetric, suspect due to prior stroke. Neck: There is a single, non-tender ~1.5cm cervical lymph node on the left side of the neck. There are no overlying skin changes.   Cardio: Regular rate and rhythm without murmur Pulm: Normal WOB on  RA, coarse lung sounds throughout but no focal findings  Abd: Non-tender, non-distended  Neuro: L-sided hemiparesis   ASSESSMENT/PLAN:   Assessment & Plan Lymphadenopathy Painless adenopathy in this patient at increased risk for head and neck cancers is quite concerning for possible underlying malignancy.  - CBC w diff, CMP, LDH, HIV - CT Neck w/ Contrast, CT Chest w/ Contrast  - Will schedule follow-up pending results of the above tests  Alcohol use Congratulated on the success that he has had and cutting back his alcohol use.  Will obtain a CMP to evaluate liver function today. Pruritus No obvious skin changes at this time.  Certainly he could have had a transient localized skin irritation related to wearing belts around his neck at all times.  Also must consider his history of polycythemia as a possible contributor and also the possibility of underlying lymphoma with his pruritus and lymphadenopathy. -CBC with differential as above -Supply of Kenalog sent for itching rashes  J Dorothyann Gibbs, MD [facility]

## 2023-03-27 NOTE — Patient Instructions (Signed)
 Mr. Hofman,  It is great to meet you. I'm sorry that you're having these symptoms. I am worried enough about this that I think we need to do some more workup. Let's check some bloodwork today and also get CT scans of both your neck and your chest. This may tell us what that lymph node is reacting to. I am also going to check in on your liver.  I will call you based on the bloodwork results and we can schedule follow-up from there.   Eliezer Mccoy, MD

## 2023-03-28 MED ORDER — TRIAMCINOLONE ACETONIDE 0.1 % EX OINT: 1.0000 | TOPICAL_OINTMENT | Freq: Two times a day (BID) | CUTANEOUS | 1 refills | Status: DC

## 2023-03-28 NOTE — Assessment & Plan Note (Signed)
 Congratulated on the success that he has had and cutting back his alcohol use.  Will obtain a CMP to evaluate liver function today.

## 2023-03-29 LAB — CBC WITH DIFFERENTIAL/PLATELET
Basophils Absolute: 0 10*3/uL (ref 0.0–0.2)
Basos: 1 %
EOS (ABSOLUTE): 0 10*3/uL (ref 0.0–0.4)
Eos: 1 %
Hematocrit: 48.5 % (ref 37.5–51.0)
Hemoglobin: 16.2 g/dL (ref 13.0–17.7)
Immature Grans (Abs): 0 10*3/uL (ref 0.0–0.1)
Immature Granulocytes: 0 %
Lymphocytes Absolute: 1.2 10*3/uL (ref 0.7–3.1)
Lymphs: 37 %
MCH: 30.4 pg (ref 26.6–33.0)
MCHC: 33.4 g/dL (ref 31.5–35.7)
MCV: 91 fL (ref 79–97)
Monocytes Absolute: 0.2 10*3/uL (ref 0.1–0.9)
Monocytes: 7 %
Neutrophils Absolute: 1.7 10*3/uL (ref 1.4–7.0)
Neutrophils: 54 %
Platelets: 203 10*3/uL (ref 150–450)
RBC: 5.33 x10E6/uL (ref 4.14–5.80)
RDW: 13.1 % (ref 11.6–15.4)
WBC: 3.2 10*3/uL — ABNORMAL LOW (ref 3.4–10.8)

## 2023-03-29 LAB — COMPREHENSIVE METABOLIC PANEL
ALT: 9 IU/L (ref 0–44)
AST: 16 IU/L (ref 0–40)
Albumin: 4.4 g/dL (ref 3.9–4.9)
Alkaline Phosphatase: 56 IU/L (ref 44–121)
BUN/Creatinine Ratio: 12 (ref 10–24)
BUN: 9 mg/dL (ref 8–27)
Bilirubin Total: 0.4 mg/dL (ref 0.0–1.2)
CO2: 24 mmol/L (ref 20–29)
Calcium: 9.6 mg/dL (ref 8.6–10.2)
Chloride: 104 mmol/L (ref 96–106)
Creatinine, Ser: 0.77 mg/dL (ref 0.76–1.27)
Globulin, Total: 2.8 g/dL (ref 1.5–4.5)
Glucose: 81 mg/dL (ref 70–99)
Potassium: 4.4 mmol/L (ref 3.5–5.2)
Sodium: 142 mmol/L (ref 134–144)
Total Protein: 7.2 g/dL (ref 6.0–8.5)
eGFR: 101 mL/min/{1.73_m2} (ref >59)

## 2023-03-29 LAB — LACTATE DEHYDROGENASE: LDH: 137 IU/L (ref 121–224)

## 2023-03-29 LAB — HIV ANTIBODY (ROUTINE TESTING W REFLEX): HIV Screen 4th Generation wRfx: NONREACTIVE

## 2023-04-02 ENCOUNTER — Encounter: Payer: Self-pay | Admitting: Student

## 2023-04-03 ENCOUNTER — Telehealth: Payer: Self-pay

## 2023-04-03 NOTE — Telephone Encounter (Signed)
-----   Message from Eliezer Mccoy sent at 04/02/2023 12:11 PM EST ----- Hi team, could you assist in getting Mr. Orihuela CT Scans scheduled please? Thanks,  Eliezer Mccoy, MD

## 2023-04-03 NOTE — Telephone Encounter (Signed)
 CT's are scheduled, patient informed. Penni Bombard CMA

## 2023-04-11 ENCOUNTER — Ambulatory Visit (HOSPITAL_COMMUNITY)
Admission: RE | Admit: 2023-04-11 | Discharge: 2023-04-11 | Disposition: A | Discharge status: Home / Self Care | Source: Ambulatory Visit | Attending: Family Medicine | Admitting: Family Medicine

## 2023-04-11 ENCOUNTER — Ambulatory Visit (HOSPITAL_COMMUNITY)

## 2023-04-11 DIAGNOSIS — R599 Enlarged lymph nodes, unspecified: Secondary | ICD-10-CM | POA: Diagnosis not present

## 2023-04-11 DIAGNOSIS — R591 Generalized enlarged lymph nodes: Secondary | ICD-10-CM | POA: Insufficient documentation

## 2023-04-11 DIAGNOSIS — M47812 Spondylosis without myelopathy or radiculopathy, cervical region: Secondary | ICD-10-CM | POA: Diagnosis not present

## 2023-04-11 MED ORDER — IOHEXOL 300 MG/ML  SOLN
75.0000 mL | Freq: Once | INTRAMUSCULAR | Status: AC | PRN
  Administered 2023-04-11: 75 mL via INTRAVENOUS

## 2023-04-25 ENCOUNTER — Telehealth: Payer: Self-pay

## 2023-04-25 NOTE — Telephone Encounter (Signed)
 Spoke with [name] regarding CT.   Per referral note from Dr. Marisue Humble, he was not going to pursue CT chest at this time.   Called patient back and provided with this update.   Veronda Prude, RN

## 2023-04-25 NOTE — Telephone Encounter (Signed)
 Patient calls nurse line regarding insurance approval for CT chest. He was able to get the CT of his neck, however, was told that his insurance was not covering the CT scan of his chest.   Will forward to [facility] for further assistance with authorization.   Veronda Prude, RN

## 2023-06-06 ENCOUNTER — Encounter: Payer: Self-pay | Admitting: Family Medicine

## 2023-06-06 ENCOUNTER — Ambulatory Visit (INDEPENDENT_AMBULATORY_CARE_PROVIDER_SITE_OTHER): Admitting: Family Medicine

## 2023-06-06 VITALS — BP 119/64 | HR 73 | Ht 68.0 in

## 2023-06-06 DIAGNOSIS — Z23 Encounter for immunization: Secondary | ICD-10-CM

## 2023-06-06 DIAGNOSIS — I10 Essential (primary) hypertension: Secondary | ICD-10-CM

## 2023-06-06 DIAGNOSIS — Z720 Tobacco use: Secondary | ICD-10-CM

## 2023-06-06 DIAGNOSIS — Z5986 Financial insecurity: Secondary | ICD-10-CM | POA: Diagnosis not present

## 2023-06-06 DIAGNOSIS — M542 Cervicalgia: Secondary | ICD-10-CM

## 2023-06-06 DIAGNOSIS — G8929 Other chronic pain: Secondary | ICD-10-CM

## 2023-06-06 LAB — POCT GLYCOSYLATED HEMOGLOBIN (HGB A1C): Hemoglobin A1C: 5 % (ref 4.0–5.6)

## 2023-06-06 NOTE — Assessment & Plan Note (Signed)
 Unfortunately still smoking up to a pack per day, has been unsuccessful with nicotine  replacement and bupropion  and Chantix  is too expensive.  Extensively discussed setting a plan to quit, patient in agreement. -CT chest for lung cancer screening due in 07/2023

## 2023-06-06 NOTE — Patient Instructions (Signed)
 It was wonderful to see you today! Thank you for choosing [facility].   Please bring ALL of your medications with you to every visit.   Today we talked about:  For your neck pain we are doing a trigger point injection today and I am giving you some neck exercises that you can try at home.  Please use Tylenol  you can do up to 1000 mg 4 times per day as a maximum dose.  I think your pain is due to your position in the chair so doing exercises will help prevent the pain from worsening. I placed a referral for our social work team to discuss [facility] financial assistance with you given your difficulty paying for medical care. Your blood pressure looks good today.  Please continue take your medication. Most important part with smoking cessation is to try to set a plan.  I know that you do not want to use nicotine  replacement or medication but having a goal for reducing the amount of cigarettes you smoke per week will help you set more long-term goals and improve your chances of quitting.  You can also use the quit hotline as you have done previously to help with accountability.  Please follow up in 6 months.  If you haven't already, sign up for My Chart to have easy access to your labs results, and communication with your primary care physician.   We are checking some labs today. If they are abnormal, I will call you. If they are normal, I will send you a MyChart message (if it is active) or a letter in the mail. If you do not hear about your labs in the next 2 weeks, please call the office.  Call the clinic at [phone] if your symptoms worsen or you have any concerns.  Please be sure to schedule follow up at the front desk before you leave today.   Jonne Netters, DO Family Medicine

## 2023-06-06 NOTE — Progress Notes (Signed)
    SUBJECTIVE:   CHIEF COMPLAINT / HPI:   Tobacco use Unfortunately still smoking 3/4-1 whole pack per day.  Has tried nicotine  replacement but is not effective.  Has tried bupropion  without effect.  Chantix  is too expensive.  States she has done the quit line but still not had success.  States he continues to pray and use toothpicks to help.  Neck pain Chronic, has left-sided hemiparesis and therefore favors his right side.  Gets around in a motorized wheelchair.  Forts the pain is waking him up at night.  Taking baclofen  occasionally that helps with mobility but not pain.  Not taking Tylenol .  Using lidocaine  patches without effect.  Very adamant he would like prednisone  today as it has helped him in the past.  PERTINENT  PMH / PSH: Wheelchair bound, L sided hemiplegia 2/2 CVA, HTN, COPD, tobacco use   OBJECTIVE:   BP 119/64   Pulse 73   Ht 5\' 8"  (1.727 m)   SpO2 98%   BMI 25.09 kg/m    General: NAD, pleasant, able to participate in exam Cardiac: RRR, no murmurs. Respiratory: CTAB, normal effort, No wheezes, rales or rhonchi Abdomen: Bowel sounds present, nontender, nondistended Extremities: no edema or cyanosis. Skin: warm and dry, no rashes noted Neuro: alert, no obvious focal deficits Psych: Normal affect and mood MSK: Notably holding head towards the right side throughout exam.  Significant right cervical paraspinal hypertonicity and right trapezius hypertonicity.  Palpable point of tension on medial right trapezius noted.  Nontender over cervical spinous process.  5/5 grip strength and bicep flexion/extension on right side.  Left side without any movement in the setting of CVA.  ASSESSMENT/PLAN:   Assessment & Plan Primary hypertension 119/64, well-controlled on current regimen Financial insecurity Difficulty affording medications and medical care, would like to consider the [facility] financial assistance program if he qualifies. -Referral to [facility] for assistance Neck pain,  chronic [M54.2, G89.29] Chronic, likely positional in the setting of left-sided hemiplegia with primary use of his right side.  Given palpable tender point, perform trigger point injection over affected area.  Patient adamant he would like prednisone , discussed risk extensively and will hold off at this time. -Trigger point injection of right trapezius as below -Provided home neck exercises and continue supportive care with heat and Tylenol  Tobacco abuse Unfortunately still smoking up to a pack per day, has been unsuccessful with nicotine  replacement and bupropion  and Chantix  is too expensive.  Extensively discussed setting a plan to quit, patient in agreement. -CT chest for lung cancer screening due in 07/2023     PROCEDURE: Trigger point injection Patient was given informed consent, signed copy in the chart. Appropriate time out was taken. Area prepped and draped in usual sterile fashion. A 25 gauge 1 inch needle was used. 1 cc of 1% lidocaine  without epinephrine was injected into the right trapezius at most tender point.  The patient tolerated the procedure well. There were no complications. Post procedure instructions were given.   Dr. Jonne Netters, DO [facility]

## 2023-06-06 NOTE — Assessment & Plan Note (Signed)
 119/64, well-controlled on current regimen

## 2023-06-07 LAB — BASIC METABOLIC PANEL WITH GFR
BUN/Creatinine Ratio: 9 — ABNORMAL LOW (ref 10–24)
BUN: 7 mg/dL — ABNORMAL LOW (ref 8–27)
CO2: 25 mmol/L (ref 20–29)
Calcium: 9.4 mg/dL (ref 8.6–10.2)
Chloride: 102 mmol/L (ref 96–106)
Creatinine, Ser: 0.78 mg/dL (ref 0.76–1.27)
Glucose: 84 mg/dL (ref 70–99)
Potassium: 3.8 mmol/L (ref 3.5–5.2)
Sodium: 139 mmol/L (ref 134–144)
eGFR: 100 mL/min/{1.73_m2} (ref >59)

## 2023-06-07 LAB — LIPID PANEL
Chol/HDL Ratio: 1.7 ratio (ref 0.0–5.0)
Cholesterol, Total: 90 mg/dL — ABNORMAL LOW (ref 100–199)
HDL: 54 mg/dL (ref >39)
LDL Chol Calc (NIH): 24 mg/dL (ref 0–99)
Triglycerides: 46 mg/dL (ref 0–149)
VLDL Cholesterol Cal: 12 mg/dL (ref 5–40)

## 2023-06-09 ENCOUNTER — Encounter: Payer: Self-pay | Admitting: Family Medicine

## 2023-06-13 ENCOUNTER — Telehealth: Payer: Self-pay | Admitting: *Deleted

## 2023-06-13 ENCOUNTER — Other Ambulatory Visit: Payer: Self-pay

## 2023-06-13 NOTE — Patient Outreach (Addendum)
 Complex Care Management   Visit Note  06/13/2023  Name:  Vincent Byrd MRN: [id number] DOB: 10-17-1960  Situation: Referral received for Complex Care Management related to complex care services. I obtained verbal consent from Patient.  Visit completed with patient on the phone   Assessment: BSW followed up with patient about complex care services. BSW offered a list of resources and patient declined. BSW let patient know that they can reach out if they need resources at a different time.     Recommendation:   No recommendations at this time.  Follow Up Plan:   Patient has met all care management goals. Care Management case will be closed. Patient has been provided contact information should new needs arise.   Haven Lion, BSW [place]  [facility] Social Worker, [employer] [facility] [phone]

## 2023-06-13 NOTE — Patient Instructions (Signed)
 Visit Information  Vincent Byrd was given information about Medicaid Managed Care team care coordination services as a part of their [place] Medicaid benefit. Vincent Byrd verbally consented to engagement with the [facility] Managed Care team.   If you are experiencing a medical emergency, please call 911 or report to your local emergency department or urgent care.   If you have a non-emergency medical problem during routine business hours, please contact your provider's office and ask to speak with a nurse.   For questions related to your [place] health plan, please call: [phone]  If you would like to schedule transportation through your [place] plan, please call the following number at least 2 days in advance of your appointment: [phone].   There is no limit to the number of trips during the year between medical appointments, healthcare facilities, or pharmacies. Transportation must be scheduled at least 2 business days before but not more than thirty 30 days before of your appointment.  Call the Behavioral Health Crisis Line at [phone], at any time, 24 hours a day, 7 days a week. If you are in danger or need immediate medical attention call 911.  If you would like help to quit smoking, call 1-800-QUIT-NOW (7318809830) OR Espaol: 1-855-Djelo-Ya (4-403-474-2595) o para ms informacin haga clic aqu or Text READY to 638-756 to register via text    Patient verbalizes understanding of instructions and care plan provided today and agrees to view in MyChart. Active MyChart status and patient understanding of how to access instructions and care plan via MyChart confirmed with patient.     No further follow up required: Vincent provided patient with contact information incase services are needed at a different time.   Vincent Byrd, Vincent Byrd  [facility] Social Worker, [facility] [phone]   Following is a copy of your plan of care:  There are no care plans that you recently modified to display for this patient.

## 2023-06-13 NOTE — Progress Notes (Signed)
 Complex Care Management Note  Care Guide Note 06/13/2023 Name: Vincent Byrd MRN: [id number] DOB: 04-07-1960  Vincent Byrd is a 64 y.o. year old male who sees Jonne Netters, MD for primary care. I reached out to Mozell Arias by phone today to offer complex care management services.  Mr. Arseneau was given information about Complex Care Management services today including:   The Complex Care Management services include support from the care team which includes your Nurse Care Manager, Clinical Social Worker, or Pharmacist.  The Complex Care Management team is here to help remove barriers to the health concerns and goals most important to you. Complex Care Management services are voluntary, and the patient may decline or stop services at any time by request to their care team member.   Complex Care Management Consent Status: Patient agreed to services and verbal consent obtained.   Follow up plan:  Telephone appointment with complex care management team member scheduled for:  5/16  Encounter Outcome:  Patient Scheduled  Barnie Bora  [facility]  [facility] Guide  Direct Dial: [phone]  Fax [phone]

## 2023-07-14 NOTE — Progress Notes (Signed)
 This encounter was created in error - please disregard.

## 2023-09-15 ENCOUNTER — Telehealth: Payer: Self-pay

## 2023-09-15 NOTE — Telephone Encounter (Signed)
 Patient calls nurse line requesting to speak with PCP.   He reports he will be having dental work done in the next few weeks. He reports he was told he would be numbed and may have some discomfort with solid foods for a few days.   He reports he is requesting Prednisone . He reports he knows this medication will help you gain weight. He reports he has some concerns with losing weight post procedure.   Advised he would need an apt to discuss this.   He reports he will call back to schedule.

## 2023-09-16 ENCOUNTER — Other Ambulatory Visit: Payer: Self-pay | Admitting: *Deleted

## 2023-09-16 MED ORDER — AMLODIPINE BESYLATE 5 MG PO TABS: 5.0000 mg | ORAL_TABLET | Freq: Every day | ORAL | 3 refills | Status: AC

## 2023-09-23 ENCOUNTER — Telehealth: Payer: Self-pay

## 2023-09-23 NOTE — Telephone Encounter (Signed)
 Patient calls nurse line requesting letter from Dr. Theophilus regarding IBS diagnosis.   Patient reports that he will sometimes have to cancel his ride with [employer] due to IBS symptoms and not being able to get on the bus.   He reports that he is only allotted a certain amount of cancellations and needs doctor's note to excuse this.   Requesting that letter be sent to home address.   Patient also reports that he can only come into the office on Thursdays. PCP does not have any Thursday availability in September. Patient will try to call back next month for October schedule.   Chiquita JAYSON English, RN

## 2023-10-15 ENCOUNTER — Encounter: Payer: Self-pay | Admitting: Family Medicine

## 2023-10-31 NOTE — Progress Notes (Signed)
 Vincent Byrd                                          MRN: [id number]   10/31/2023   The [facility] Quality Team Specialist reviewed this patient medical record for the purposes of chart review for care gap closure. The following were reviewed: chart review for care gap closure-kidney health evaluation for diabetes:eGFR  and uACR.    [facility] Quality Team

## 2023-11-10 ENCOUNTER — Telehealth: Payer: Self-pay

## 2023-11-10 DIAGNOSIS — G8190 Hemiplegia, unspecified affecting unspecified side: Secondary | ICD-10-CM

## 2023-11-10 NOTE — Telephone Encounter (Signed)
 Patient calls nurse line requesting orders from Dr. Theophilus for batteries for his power wheelchair.   He needs for this DME order to be faxed to [employer] at [phone]. Pended DME to encounter.   Will forward request to PCP.   Chiquita JAYSON English, RN

## 2023-11-11 NOTE — Telephone Encounter (Signed)
 Patient did not answer. LVM. Cassell Mary CMA

## 2023-11-14 NOTE — Telephone Encounter (Signed)
 Patient calls nurse line to check on status of DME order.   Faxed to [employer].   Vincent JAYSON English, RN

## 2023-11-27 ENCOUNTER — Encounter: Payer: Self-pay | Admitting: Family Medicine

## 2023-11-27 ENCOUNTER — Ambulatory Visit: Admitting: Family Medicine

## 2023-11-27 VITALS — BP 130/74 | HR 79

## 2023-11-27 DIAGNOSIS — F17200 Nicotine dependence, unspecified, uncomplicated: Secondary | ICD-10-CM | POA: Diagnosis not present

## 2023-11-27 DIAGNOSIS — J449 Chronic obstructive pulmonary disease, unspecified: Secondary | ICD-10-CM | POA: Diagnosis not present

## 2023-11-27 DIAGNOSIS — R634 Abnormal weight loss: Secondary | ICD-10-CM

## 2023-11-27 DIAGNOSIS — Z5941 Food insecurity: Secondary | ICD-10-CM | POA: Diagnosis not present

## 2023-11-27 DIAGNOSIS — Z23 Encounter for immunization: Secondary | ICD-10-CM

## 2023-11-27 MED ORDER — MIRTAZAPINE 7.5 MG PO TABS: 7.5000 mg | ORAL_TABLET | Freq: Every day | ORAL | 2 refills | Status: DC

## 2023-11-27 NOTE — Progress Notes (Signed)
    SUBJECTIVE:   CHIEF COMPLAINT / HPI:   Tobacco use  1/2 to 1/4 pack per day. Using prayer to help with cessation.  Taking peppermint and it helps. Still has nicotine  patches if needed.  Declined additional support.  States he has had stressors in his life which make him want to smoke more.  Due for lung cancer screening, patient states he does not leave the house in the winter and would not be able to have it done until April of next year.  Declined ordering imaging until that time.  Weight loss Patient concerned he needs to gain more weight.  Reports he is eating 2 meals per day currently.  Has tried Ensure/boost supplements but unable to afford them.  Previously connected with [facility], states he now has someone dropping off food once per month but still running out of food the last week of the month.  Reports his son can sometimes come from [place] to help him out.  Requesting prednisone  to help him gain weight.  PERTINENT  PMH / PSH: Wheelchair bound, L sided hemiplegia 2/2 CVA, HTN, COPD, tobacco use   OBJECTIVE:   BP 130/74   Pulse 79   SpO2 99%    General: NAD, pleasant, able to participate in exam Cardiac: RRR, no murmurs. Respiratory: Normal work of breathing on room air.  Intermittent deep cough.  Some wheezing in 2 lung fields noted. Extremities: no edema or cyanosis. Skin: warm and dry, no rashes noted Neuro: alert, contracture of left side.  Presents via motorized wheelchair. Psych: Normal affect and mood  ASSESSMENT/PLAN:   Assessment & Plan Chronic obstructive pulmonary disease, unspecified COPD type (HCC) Likely chronic bronchitis, would benefit from daily maintenance inhaler.  Unfortunately most LAMA/LABA inhalers not covered by insurance and financially prohibitive.  Will reach out to pharmacy team to discuss affordable options. Weight loss No documented weight in the past 2 years, patient adamantly declined weight today given hemiplegia and difficulty obtaining  weight in clinic.  Unclear if weight loss actually occurring.  Requesting prednisone , declined due to risk and side effects.  Open to retrialing mirtazapine  for appetite stimulation, reordered.  Likely related to food insecurity as below. -Start mirtazapine  7.5 mg nightly Tobacco use disorder Difficulty cutting back, declined additional medication management.  Due for lung cancer screening, patient declined and would like to reschedule until the spring. -Need to order lung cancer screening at follow-up Food insecurity Offered food bag, declined due to inability to care at home today.  Already seen by [facility] team and receiving home food delivery monthly.  Recommend reaching out to the clinic if runs out of food for additional support. Encounter for immunization Received flu shot   Dr. Izetta Nap, DO [facility]

## 2023-11-27 NOTE — Assessment & Plan Note (Signed)
 Difficulty cutting back, declined additional medication management.  Due for lung cancer screening, patient declined and would like to reschedule until the spring. -Need to order lung cancer screening at follow-up

## 2023-11-27 NOTE — Patient Instructions (Signed)
 It was wonderful to see you today! Thank you for choosing [facility].   Please bring ALL of your medications with you to every visit.   Today we talked about:  Please try to cut back on smoking is much as possible and utilize the peppermint and nicotine  patches as needed.  If you need any further assistance to stop please let me know.  You are due for your lung cancer screening, it was ordered earlier this year and we should try to have it done as soon as she is available. I will order a medication for your COPD after I discussed with our pharmacy team of what might be covered by your insurance as the typical options would be too expensive.  Our office will follow-up with you regarding this information. For your appetite the mirtazapine  can help stimulate your appetite to help you maintain your weight.  I do think we need to get your weight on you at your next visit to have a weight trend.  This is a safer option than considering the prednisone .  Please follow up in 3 months   If you haven't already, sign up for My Chart to have easy access to your labs results, and communication with your primary care physician.   We are checking some labs today. If they are abnormal, I will call you. If they are normal, I will send you a MyChart message (if it is active) or a letter in the mail. If you do not hear about your labs in the next 2 weeks, please call the office.  Call the clinic at [phone] if your symptoms worsen or you have any concerns.  Please be sure to schedule follow up at the front desk before you leave today.   Izetta Nap, DO Family Medicine

## 2023-11-28 ENCOUNTER — Other Ambulatory Visit (HOSPITAL_COMMUNITY): Payer: Self-pay

## 2023-12-02 ENCOUNTER — Telehealth: Payer: Self-pay

## 2023-12-02 ENCOUNTER — Telehealth: Payer: Self-pay | Admitting: Pharmacist

## 2023-12-02 ENCOUNTER — Other Ambulatory Visit (HOSPITAL_COMMUNITY): Payer: Self-pay

## 2023-12-02 NOTE — Telephone Encounter (Signed)
 Reviewed and agree with Dr Rennis plan.

## 2023-12-02 NOTE — Telephone Encounter (Signed)
-----   Message from Izetta Nap sent at 11/27/2023  3:09 PM EDT ----- Regarding: RE: COPD inhalers Thanks for looking into this!  Every time I try to order any of those options it says the patient will have to pay ~$300, do think this is the case or he would have to meet a deductible prior to coverage? ----- Message ----- From: Amalia Maude MATSU, RPH-CPP Sent: 11/27/2023   2:57 PM EDT To: Lavern LOISE Ku, CPhT; Izetta Nap, MD Subject: COPD inhalers                                  Appears the following are preferred per coverage APP  Breztri (glycopyronium / formoterol / budesonide) Trelegy (umeclidinium / vilanterol / fluticasone )  Stiolto (tiotropium / olodaterol)  Spiriva (tiotropium)   Hope that helps. ----- Message ----- From: Nap Izetta, MD Sent: 11/27/2023  11:02 AM EDT To: [facility]  Can you run a test claim for COPD inhalers?

## 2023-12-02 NOTE — Telephone Encounter (Signed)
 In process of having patient complete [employer] application for [employer] assistance.

## 2023-12-02 NOTE — Telephone Encounter (Signed)
 Mailed application to patient home.

## 2023-12-02 NOTE — Telephone Encounter (Signed)
 Patient contacted for follow-up of medication access.    Medication Plan: - Apply for Stiolto (tiotropium / olodaterol)  for treatment from manufacturer assistance program. Discussed application process with patient.  Application sent in mail today by Lavern Ku, CPhT   Medication Samples have been provided to the patient.  Drug name: Breztri (glycopyronium / formoterol / budesonide)           Qty: 1  LOT: [id number]  Exp.Date: 1//30/2027  Dosing instructions: 2 inhalations BID  The patient has been instructed regarding the correct time, dose, and frequency of taking this medication, including desired effects and most common side effects.   Vincent Byrd 10:59 AM 12/02/2023    Total time with patient call and documentation of interaction: 12 minutes.

## 2023-12-10 NOTE — Telephone Encounter (Signed)
 Patient calls nurse line in regards to sample inhaler.   Patient is requesting to have the sample mailed to his home.   He reports transportation issues and does not tolerate the cold weather well.   Advised I was unsure if we were able to mail the sample to his home address.   Confirmed address with patient.

## 2023-12-11 ENCOUNTER — Telehealth: Payer: Self-pay | Admitting: Pharmacist

## 2023-12-11 NOTE — Telephone Encounter (Signed)
 Patient contacted for follow-up of medication access of inhalers (Breztri (glycopyronium / formoterol / budesonide)  and follow-up QI'25. Reach out to schedule patient with PCP for lipid panel and UACR.  Shared with patient that we unable to mail sample of Breztri to his home. Patient reported that he does not have transportation to come to clinic to pick up the medication and needed to wait for his son get off from school at 4 PM. Advised patient to fill out the application for patient assistance program that will be mailed to his home. Provided patient with contact number of LIS to help reducing cost of inhaler.   Regarding 2025 QI, patient reported he does not have a diagnosis of diabetes. No appointment is scheduled with PCP in the meantime.   Total time with patient call and documentation of interaction: 15 minutes.

## 2023-12-11 NOTE — Telephone Encounter (Signed)
 Contacted and discussed inability to supply sample through the mail.   Shared LIS information - anticipate either approval or medication assistance.

## 2023-12-12 NOTE — Telephone Encounter (Signed)
 Reviewed and agree with Dr Rennis plan.

## 2023-12-18 ENCOUNTER — Telehealth: Payer: Self-pay

## 2023-12-18 NOTE — Telephone Encounter (Signed)
 SCAT form found in RN box.   I called patient. He reports he would like for us  to mail the completed form to his home address.   Confirmed address with patient.   Copy made for batch scanning.   Original placed in mail pile.

## 2023-12-22 ENCOUNTER — Other Ambulatory Visit: Payer: Self-pay

## 2023-12-22 DIAGNOSIS — E785 Hyperlipidemia, unspecified: Secondary | ICD-10-CM

## 2023-12-22 MED ORDER — ATORVASTATIN CALCIUM 40 MG PO TABS: 40.0000 mg | ORAL_TABLET | Freq: Every day | ORAL | 3 refills | Status: AC

## 2023-12-30 NOTE — Telephone Encounter (Signed)
 Rec'd application back from patient.   Missing patient signature.  Application placed in PCPs box for signature.

## 2024-01-02 ENCOUNTER — Telehealth: Payer: Self-pay

## 2024-01-02 NOTE — Telephone Encounter (Signed)
 Patient LVM on nurse line regarding SCAT paperwork. Returned call to patient.   He reports that previously submitted paperwork was not sufficient. 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 Needs to state that if he is having an episode with IBS he will need to urgently cancel his transportation for the day and this should not be held against him.   He does not want to lose this service.   He is going to be mailing us  forms for PCP to complete and then fax back to [place].   Fax number for access [place] is [phone].  Chiquita JAYSON English, RN

## 2024-01-06 ENCOUNTER — Other Ambulatory Visit: Payer: Self-pay | Admitting: Family Medicine

## 2024-01-06 DIAGNOSIS — J449 Chronic obstructive pulmonary disease, unspecified: Secondary | ICD-10-CM

## 2024-01-06 MED ORDER — STIOLTO RESPIMAT 2.5-2.5 MCG/ACT IN AERS: 2.0000 | INHALATION_SPRAY | Freq: Every day | RESPIRATORY_TRACT | Status: AC

## 2024-01-06 NOTE — Telephone Encounter (Signed)
 Contacted patient to verify income. Patient also agreed to have me sign application on his behalf due to him not being able to come back out to office.   Patient will have his son help him find his SSI award letter and fax it to me. Patient given my direct line to call and my direct fax.

## 2024-01-06 NOTE — Telephone Encounter (Signed)
 PAP: Application for Stioloto has been submitted to [employer]), via fax

## 2024-01-06 NOTE — Telephone Encounter (Signed)
 Pt states that he just mailed a new form today and it has the information on where to fax it back. Sosha Shepherd [place], CMA

## 2024-01-08 NOTE — Telephone Encounter (Signed)
 Patient returns call to nurse line. He reports that access [place] forms have been rejected as he has been inputting information into part B.   Part B needs to be completed only by provider. He is mailing new forms with blank part B to provider to complete.   He states that if provider has questions about completing part B, he requested a returned call.   Once complete, we can fax back to [phone].   Chiquita JAYSON English, RN

## 2024-01-14 NOTE — Telephone Encounter (Signed)
 Income rec'd. Scanned to patients media.

## 2024-01-19 NOTE — Telephone Encounter (Signed)
Faxed proof of income

## 2024-01-20 NOTE — Telephone Encounter (Signed)
 Copy made for batch scanning.  Faxed back [phone].

## 2024-01-23 NOTE — Telephone Encounter (Signed)
 Fantastic! Thanks for all your work on this!!

## 2024-01-23 NOTE — Telephone Encounter (Signed)
 PAP: Patient assistance application for Stiolto has been approved by PAP Companies: [employer] from 01/20/24 to 01/27/25.   Medication should be delivered to: Home.   For further shipping updates, please contact [employer] ([employer]) at [phone].   Patient ID is: [id number]

## 2024-01-26 NOTE — Telephone Encounter (Signed)
 Attempted to contact patient for follow-up of Stiolto (tiotropium / olodaterol) respimat MAP   Patient is aware of approval and anticipated delivery in 7--10 day.   Patient educated on purpose, proper use and potential adverse effects.  However description of use without device in-hand was challenging.  Asked patient to call if he had questions about use once product arrives at his house. Following instruction patient verbalized understanding of treatment plan.   Total time with patient call and documentation of interaction: 9 minutes.  9

## 2024-02-02 NOTE — Telephone Encounter (Signed)
 Reviewed and agree with Dr Rennis plan.

## 2024-02-09 ENCOUNTER — Other Ambulatory Visit: Payer: Self-pay | Admitting: Family Medicine

## 2024-02-09 DIAGNOSIS — R634 Abnormal weight loss: Secondary | ICD-10-CM
